# Patient Record
Sex: Female | Born: 1985 | Race: Black or African American | Hispanic: No | Smoking: Never smoker
Health system: Southern US, Community
[De-identification: ages and names within clinical notes are randomized; demographics above are authoritative.]

## PROBLEM LIST (undated history)

## (undated) ENCOUNTER — Inpatient Hospital Stay: Payer: Self-pay

## (undated) DIAGNOSIS — Z23 Encounter for immunization: Secondary | ICD-10-CM

## (undated) DIAGNOSIS — Z8619 Personal history of other infectious and parasitic diseases: Secondary | ICD-10-CM

## (undated) DIAGNOSIS — O219 Vomiting of pregnancy, unspecified: Secondary | ICD-10-CM

## (undated) DIAGNOSIS — F1290 Cannabis use, unspecified, uncomplicated: Secondary | ICD-10-CM

## (undated) DIAGNOSIS — N760 Acute vaginitis: Secondary | ICD-10-CM

## (undated) DIAGNOSIS — D649 Anemia, unspecified: Secondary | ICD-10-CM

## (undated) DIAGNOSIS — R519 Headache, unspecified: Secondary | ICD-10-CM

## (undated) DIAGNOSIS — B9689 Other specified bacterial agents as the cause of diseases classified elsewhere: Secondary | ICD-10-CM

## (undated) HISTORY — DX: Encounter for immunization: Z23

## (undated) HISTORY — DX: Acute vaginitis: N76.0

## (undated) HISTORY — DX: Personal history of other infectious and parasitic diseases: Z86.19

## (undated) HISTORY — DX: Other specified bacterial agents as the cause of diseases classified elsewhere: B96.89

## (undated) HISTORY — DX: Cannabis use, unspecified, uncomplicated: F12.90

## (undated) HISTORY — DX: Anemia, unspecified: D64.9

## (undated) HISTORY — PX: NO PAST SURGERIES: SHX2092

## (undated) HISTORY — DX: Vomiting of pregnancy, unspecified: O21.9

## (undated) HISTORY — DX: Headache, unspecified: R51.9

---
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
## 2007-05-09 ENCOUNTER — Emergency Department: Payer: Self-pay | Admitting: Emergency Medicine

## 2007-06-02 ENCOUNTER — Emergency Department: Payer: Self-pay | Admitting: Emergency Medicine

## 2007-07-31 ENCOUNTER — Emergency Department: Payer: Self-pay | Admitting: Internal Medicine

## 2008-03-18 ENCOUNTER — Emergency Department: Payer: Self-pay | Admitting: Emergency Medicine

## 2008-05-11 ENCOUNTER — Emergency Department: Payer: Self-pay | Admitting: Emergency Medicine

## 2008-09-19 ENCOUNTER — Observation Stay: Payer: Self-pay | Admitting: Obstetrics and Gynecology

## 2008-11-17 ENCOUNTER — Observation Stay: Payer: Self-pay | Admitting: Obstetrics and Gynecology

## 2009-08-16 DIAGNOSIS — Z8619 Personal history of other infectious and parasitic diseases: Secondary | ICD-10-CM

## 2009-08-16 HISTORY — DX: Personal history of other infectious and parasitic diseases: Z86.19

## 2011-03-31 ENCOUNTER — Emergency Department: Payer: Self-pay | Admitting: *Deleted

## 2011-12-09 ENCOUNTER — Emergency Department: Payer: Self-pay | Admitting: Internal Medicine

## 2013-04-17 ENCOUNTER — Emergency Department: Payer: Self-pay | Admitting: Emergency Medicine

## 2013-04-17 LAB — COMPREHENSIVE METABOLIC PANEL
Alkaline Phosphatase: 71 U/L (ref 50–136)
Bilirubin,Total: 0.4 mg/dL (ref 0.2–1.0)
Calcium, Total: 9.2 mg/dL (ref 8.5–10.1)
Chloride: 108 mmol/L — ABNORMAL HIGH (ref 98–107)
Co2: 27 mmol/L (ref 21–32)
Creatinine: 0.95 mg/dL (ref 0.60–1.30)
EGFR (African American): >60
Glucose: 91 mg/dL (ref 65–99)
Osmolality: 281 (ref 275–301)
Potassium: 3.6 mmol/L (ref 3.5–5.1)

## 2013-04-17 LAB — URINALYSIS, COMPLETE
Bilirubin,UR: NEGATIVE
Glucose,UR: NEGATIVE mg/dL (ref 0–75)
Leukocyte Esterase: NEGATIVE
Ph: 6 (ref 4.5–8.0)
Protein: NEGATIVE
RBC,UR: 2 /HPF (ref 0–5)
Specific Gravity: 1.027 (ref 1.003–1.030)
WBC UR: 7 /HPF (ref 0–5)

## 2013-04-17 LAB — CBC WITH DIFFERENTIAL/PLATELET
Basophil #: 0 10*3/uL (ref 0.0–0.1)
Basophil %: 0.7 %
Eosinophil #: 0.1 10*3/uL (ref 0.0–0.7)
Eosinophil %: 1.9 %
HCT: 36.5 % (ref 35.0–47.0)
Lymphocyte #: 1.8 10*3/uL (ref 1.0–3.6)
Lymphocyte %: 44.6 %
MCHC: 33.5 g/dL (ref 32.0–36.0)
Neutrophil #: 1.9 10*3/uL (ref 1.4–6.5)
Neutrophil %: 45.3 %
WBC: 4.1 10*3/uL (ref 3.6–11.0)

## 2013-08-14 ENCOUNTER — Emergency Department: Payer: Self-pay | Admitting: Emergency Medicine

## 2013-08-14 LAB — URINALYSIS, COMPLETE
Bilirubin,UR: NEGATIVE
Blood: NEGATIVE
Glucose,UR: NEGATIVE mg/dL (ref 0–75)
Ketone: NEGATIVE
Nitrite: NEGATIVE
Protein: NEGATIVE
RBC,UR: 4 /HPF (ref 0–5)
Specific Gravity: 1.023 (ref 1.003–1.030)
Squamous Epithelial: 17

## 2014-11-06 ENCOUNTER — Emergency Department: Payer: Self-pay | Admitting: Emergency Medicine

## 2015-03-22 ENCOUNTER — Encounter: Payer: Self-pay | Admitting: Emergency Medicine

## 2015-03-22 ENCOUNTER — Emergency Department: Payer: Self-pay

## 2015-03-22 ENCOUNTER — Emergency Department
Admission: EM | Admit: 2015-03-22 | Discharge: 2015-03-22 | Disposition: A | Discharge status: Home / Self Care | Payer: Self-pay | Attending: Emergency Medicine | Admitting: Emergency Medicine

## 2015-03-22 DIAGNOSIS — O9989 Other specified diseases and conditions complicating pregnancy, childbirth and the puerperium: Secondary | ICD-10-CM | POA: Insufficient documentation

## 2015-03-22 DIAGNOSIS — O23591 Infection of other part of genital tract in pregnancy, first trimester: Secondary | ICD-10-CM | POA: Insufficient documentation

## 2015-03-22 DIAGNOSIS — Z3A08 8 weeks gestation of pregnancy: Secondary | ICD-10-CM | POA: Insufficient documentation

## 2015-03-22 DIAGNOSIS — M545 Low back pain: Secondary | ICD-10-CM | POA: Insufficient documentation

## 2015-03-22 DIAGNOSIS — B9689 Other specified bacterial agents as the cause of diseases classified elsewhere: Secondary | ICD-10-CM

## 2015-03-22 DIAGNOSIS — N760 Acute vaginitis: Secondary | ICD-10-CM

## 2015-03-22 DIAGNOSIS — M549 Dorsalgia, unspecified: Secondary | ICD-10-CM

## 2015-03-22 LAB — URINALYSIS COMPLETE WITH MICROSCOPIC (ARMC ONLY)
BILIRUBIN URINE: NEGATIVE
Bacteria, UA: NONE SEEN
GLUCOSE, UA: NEGATIVE mg/dL
HGB URINE DIPSTICK: NEGATIVE
KETONES UR: NEGATIVE mg/dL
LEUKOCYTES UA: NEGATIVE
Nitrite: NEGATIVE
PROTEIN: NEGATIVE mg/dL
SPECIFIC GRAVITY, URINE: 1.013 (ref 1.005–1.030)
pH: 9 — ABNORMAL HIGH (ref 5.0–8.0)

## 2015-03-22 LAB — CHLAMYDIA/NGC RT PCR (ARMC ONLY)
CHLAMYDIA TR: NOT DETECTED
N GONORRHOEAE: NOT DETECTED

## 2015-03-22 LAB — WET PREP, GENITAL
Trich, Wet Prep: NONE SEEN
Yeast Wet Prep HPF POC: NONE SEEN

## 2015-03-22 LAB — HCG, QUANTITATIVE, PREGNANCY: HCG, BETA CHAIN, QUANT, S: 68350 m[IU]/mL — AB (ref <5)

## 2015-03-22 LAB — POCT PREGNANCY, URINE: Preg Test, Ur: POSITIVE — AB

## 2015-03-22 MED ORDER — ACETAMINOPHEN 500 MG PO TABS
1000.0000 mg | ORAL_TABLET | Freq: Once | ORAL | Status: AC
  Administered 2015-03-22: 1000 mg via ORAL
  Filled 2015-03-22: qty 2

## 2015-03-22 MED ORDER — METRONIDAZOLE 250 MG PO TABS: 250.0000 mg | ORAL_TABLET | Freq: Three times a day (TID) | ORAL | Status: AC

## 2015-03-22 NOTE — ED Notes (Signed)
Back from ultrasound

## 2015-03-22 NOTE — ED Notes (Signed)
POC U PREG , positive

## 2015-03-22 NOTE — ED Provider Notes (Signed)
[facility] Emergency Department Provider Note ____________________________________________  Time seen: 1240  I have reviewed the triage vital signs and the nursing notes.  HISTORY  Chief Complaint  Abdominal Pain  HPI Samantha Heath is a 29 y.o. female who reports to the ED with onset of low back pain since this morning. She also has noticed some discharge this morning to her panties, but denies any irregular vaginal bleeding. She reports a last menstrual period of June 10, and has confirmed pregnancy with a home test and health Department pregnancy test.  History reviewed. No pertinent past medical history.  There are no active problems to display for this patient.  History reviewed. No pertinent past surgical history.  Current Outpatient Rx  Name  Route  Sig  Dispense  Refill  . metroNIDAZOLE (FLAGYL) 250 MG tablet   Oral   Take 1 tablet (250 mg total) by mouth 3 (three) times daily.   21 tablet   0     Allergies Review of patient's allergies indicates no known allergies.  No family history on file.  Social History History  Substance Use Topics  . Smoking status: Never Smoker   . Smokeless tobacco: Not on file  . Alcohol Use: Not on file   Review of Systems  Constitutional: Negative for fever. Eyes: Negative for visual changes. ENT: Negative for sore throat. Cardiovascular: Negative for chest pain. Respiratory: Negative for shortness of breath. Gastrointestinal: Negative for abdominal pain, vomiting and diarrhea. Genitourinary: Negative for dysuria. Reports vaginal discharge. Denies vaginal bleeding Musculoskeletal: Negative for back pain. Skin: Negative for rash. Neurological: Negative for headaches, focal weakness or numbness. ____________________________________________  PHYSICAL EXAM:  VITAL SIGNS: ED Triage Vitals  Enc Vitals Group     BP 03/22/15 1145 103/73 mmHg     Pulse Rate 03/22/15 1145 86     Resp 03/22/15 1145 18      Temp 03/22/15 1145 98.2 F (36.8 C)     Temp Source 03/22/15 1145 Oral     SpO2 03/22/15 1145 100 %     Weight 03/22/15 1145 164 lb (74.39 kg)     Height 03/22/15 1145 5\' 10"  (1.778 m)     Head Cir --      Peak Flow --      Pain Score 03/22/15 1146 6     Pain Loc --      Pain Edu? --      Excl. in GC? --    Constitutional: Alert and oriented. Well appearing and in no distress. Eyes: Conjunctivae are normal. PERRL. Normal extraocular movements. ENT   Head: Normocephalic and atraumatic.   Nose: No congestion/rhinnorhea.   Mouth/Throat: Mucous membranes are moist.   Neck: Supple. No thyromegaly. Hematological/Lymphatic/Immunilogical: No cervical lymphadenopathy. Cardiovascular: Normal rate, regular rhythm.  Respiratory: Normal respiratory effort. No wheezes/rales/rhonchi. Gastrointestinal: Soft and nontender. No distention. GU: Normal external genitalia. Vagina with thin, white discharge. Cervical os closed. No adnexal tenderness or masses.  Musculoskeletal: Nontender with normal range of motion in all extremities.  Neurologic:  Normal gait without ataxia. Normal speech and language. No gross focal neurologic deficits are appreciated. Skin:  Skin is warm, dry and intact. No rash noted. Psychiatric: Mood and affect are normal. Patient exhibits appropriate insight and judgment. ____________________________________________    LABS (pertinent positives/negatives) Labs Reviewed  WET PREP, GENITAL - Abnormal; Notable for the following:    Clue Cells Wet Prep HPF POC MODERATE (*)    WBC, Wet Prep HPF POC FEW (*)  All other components within normal limits  HCG, QUANTITATIVE, PREGNANCY - Abnormal; Notable for the following:    hCG, Beta Chain, Quant, S 16109 (*)    All other components within normal limits  URINALYSIS COMPLETEWITH MICROSCOPIC ([facility] ONLY) - Abnormal; Notable for the following:    Color, Urine STRAW (*)    APPearance CLEAR (*)    pH 9.0 (*)     Squamous Epithelial / LPF 6-30 (*)    All other components within normal limits  POCT PREGNANCY, URINE - Abnormal; Notable for the following:    Preg Test, Ur POSITIVE (*)    All other components within normal limits  CHLAMYDIA/NGC RT PCR ([facility] ONLY)  POC URINE PREG, ED  ____________________________________________  RADIOLOGY  OB Ultrasound <14 weeks IMPRESSION: Single live intrauterine gestation 8 weeks 1 day by LMP.  I, Darrelle Wiberg, Charlesetta Ivory, personally viewed and evaluated these images as part of my medical decision making.  ____________________________________________  INITIAL IMPRESSION / ASSESSMENT AND PLAN / ED COURSE  Radiology and lab results to patient. Treatment for BV with Flagyl 250 mg. Exam within normal limits. Follow-up with [facility] for routine prenatal care.  ____________________________________________  FINAL CLINICAL IMPRESSION(S) / ED DIAGNOSES  Final diagnoses:  Back pain affecting pregnancy in first trimester  BV (bacterial vaginosis)     Lissa Hoard, PA-C 03/22/15 1647  Emily Filbert, MD 03/23/15 [phone]

## 2015-03-22 NOTE — Discharge Instructions (Signed)
Abdominal Pain During Pregnancy Abdominal pain is common in pregnancy. Most of the time, it does not cause harm. There are many causes of abdominal pain. Some causes are more serious than others. Some of the causes of abdominal pain in pregnancy are easily diagnosed. Occasionally, the diagnosis takes time to understand. Other times, the cause is not determined. Abdominal pain can be a sign that something is very wrong with the pregnancy, or the pain may have nothing to do with the pregnancy at all. For this reason, always tell your health care provider if you have any abdominal discomfort. HOME CARE INSTRUCTIONS  Monitor your abdominal pain for any changes. The following actions may help to alleviate any discomfort you are experiencing:  Do not have sexual intercourse or put anything in your vagina until your symptoms go away completely.  Get plenty of rest until your pain improves.  Drink clear fluids if you feel nauseous. Avoid solid food as long as you are uncomfortable or nauseous.  Only take over-the-counter or prescription medicine as directed by your health care provider.  Keep all follow-up appointments with your health care provider. SEEK IMMEDIATE MEDICAL CARE IF:  You are bleeding, leaking fluid, or passing tissue from the vagina.  You have increasing pain or cramping.  You have persistent vomiting.  You have painful or bloody urination.  You have a fever.  You notice a decrease in your baby's movements.  You have extreme weakness or feel faint.  You have shortness of breath, with or without abdominal pain.  You develop a severe headache with abdominal pain.  You have abnormal vaginal discharge with abdominal pain.  You have persistent diarrhea.  You have abdominal pain that continues even after rest, or gets worse. MAKE SURE YOU:   Understand these instructions.  Will watch your condition.  Will get help right away if you are not doing well or get  worse. Document Released: 08/02/2005 Document Revised: 05/23/2013 Document Reviewed: 03/01/2013 [facility] Patient Information 2015 [place], [place]. This information is not intended to replace advice given to you by your health care provider. Make sure you discuss any questions you have with your health care provider.  Back Pain in Pregnancy Back pain during pregnancy is common. It happens in about half of all pregnancies. It is important for you and your baby that you remain active during your pregnancy.If you feel that back pain is not allowing you to remain active or sleep well, it is time to see your caregiver. Back pain may be caused by several factors related to changes during your pregnancy.Fortunately, unless you had trouble with your back before your pregnancy, the pain is likely to get better after you deliver. Low back pain usually occurs between the fifth and seventh months of pregnancy. It can, however, happen in the first couple months. Factors that increase the risk of back problems include:   Previous back problems.  Injury to your back.  Having twins or multiple births.  A chronic cough.  Stress.  Job-related repetitive motions.  Muscle or spinal disease in the back.  Family history of back problems, ruptured (herniated) discs, or osteoporosis.  Depression, anxiety, and panic attacks. CAUSES   When you are pregnant, your body produces a hormone called relaxin. This hormonemakes the ligaments connecting the low back and pubic bones more flexible. This flexibility allows the baby to be delivered more easily. When your ligaments are loose, your muscles need to work harder to support your back. Soreness in your back  can come from tired muscles. Soreness can also come from back tissues that are irritated since they are receiving less support.  As the baby grows, it puts pressure on the nerves and blood vessels in your pelvis. This can cause back pain.  As the baby grows  and gets heavier during pregnancy, the uterus pushes the stomach muscles forward and changes your center of gravity. This makes your back muscles work harder to maintain good posture. SYMPTOMS  Lumbar pain during pregnancy Lumbar pain during pregnancy usually occurs at or above the waist in the center of the back. There may be pain and numbness that radiates into your leg or foot. This is similar to low back pain experienced by non-pregnant women. It usually increases with sitting for long periods of time, standing, or repetitive lifting. Tenderness may also be present in the muscles along your upper back. Posterior pelvic pain during pregnancy Pain in the back of the pelvis is more common than lumbar pain in pregnancy. It is a deep pain felt in your side at the waistline, or across the tailbone (sacrum), or in both places. You may have pain on one or both sides. This pain can also go into the buttocks and backs of the upper thighs. Pubic and groin pain may also be present. The pain does not quickly resolve with rest, and morning stiffness may also be present. Pelvic pain during pregnancy can be brought on by most activities. A high level of fitness before and during pregnancy may or may not prevent this problem. Labor pain is usually 1 to 2 minutes apart, lasts for about 1 minute, and involves a bearing down feeling or pressure in your pelvis. However, if you are at term with the pregnancy, constant low back pain can be the beginning of early labor, and you should be aware of this. DIAGNOSIS  X-rays of the back should not be done during the first 12 to 14 weeks of the pregnancy and only when absolutely necessary during the rest of the pregnancy. MRIs do not give off radiation and are safe during pregnancy. MRIs also should only be done when absolutely necessary. HOME CARE INSTRUCTIONS  Exercise as directed by your caregiver. Exercise is the most effective way to prevent or manage back pain. If you have a  back problem, it is especially important to avoid sports that require sudden body movements. Swimming and walking are great activities.  Do not stand in one place for long periods of time.  Do not wear high heels.  Sit in chairs with good posture. Use a pillow on your lower back if necessary. Make sure your head rests over your shoulders and is not hanging forward.  Try sleeping on your side, preferably the left side, with a pillow or two between your legs. If you are sore after a night's rest, your bedmay betoo soft.Try placing a board between your mattress and box spring.  Listen to your body when lifting.If you are experiencing pain, ask for help or try bending yourknees more so you can use your leg muscles rather than your back muscles. Squat down when picking up something from the floor. Do not bend over.  Eat a healthy diet. Try to gain weight within your caregiver's recommendations.  Use heat or cold packs 3 to 4 times a day for 15 minutes to help with the pain.  Only take over-the-counter or prescription medicines for pain, discomfort, or fever as directed by your caregiver. Sudden (acute) back pain  Use bed rest for only the most extreme, acute episodes of back pain. Prolonged bed rest over 48 hours will aggravate your condition.  Ice is very effective for acute conditions.  Put ice in a plastic bag.  Place a towel between your skin and the bag.  Leave the ice on for 10 to 20 minutes every 2 hours, or as needed.  Using heat packs for 30 minutes prior to activities is also helpful. Continued back pain See your caregiver if you have continued problems. Your caregiver can help or refer you for appropriate physical therapy. With conditioning, most back problems can be avoided. Sometimes, a more serious issue may be the cause of back pain. You should be seen right away if new problems seem to be developing. Your caregiver may recommend:  A maternity girdle.  An elastic  sling.  A back brace.  A massage therapist or acupuncture. SEEK MEDICAL CARE IF:   You are not able to do most of your daily activities, even when taking the pain medicine you were given.  You need a referral to a physical therapist or chiropractor.  You want to try acupuncture. SEEK IMMEDIATE MEDICAL CARE IF:  You develop numbness, tingling, weakness, or problems with the use of your arms or legs.  You develop severe back pain that is no longer relieved with medicines.  You have a sudden change in bowel or bladder control.  You have increasing pain in other areas of the body.  You develop shortness of breath, dizziness, or fainting.  You develop nausea, vomiting, or sweating.  You have back pain which is similar to labor pains.  You have back pain along with your water breaking or vaginal bleeding.  You have back pain or numbness that travels down your leg.  Your back pain developed after you fell.  You develop pain on one side of your back. You may have a kidney stone.  You see blood in your urine. You may have a bladder infection or kidney stone.  You have back pain with blisters. You may have shingles. Back pain is fairly common during pregnancy but should not be accepted as just part of the process. Back pain should always be treated as soon as possible. This will make your pregnancy as pleasant as possible. Document Released: 11/10/2005 Document Revised: 10/25/2011 Document Reviewed: 12/22/2010 [facility] Patient Information 2015 [place], [place]. This information is not intended to replace advice given to you by your health care provider. Make sure you discuss any questions you have with your health care provider.  Bacterial Vaginosis Bacterial vaginosis is an infection of the vagina. It happens when too many of certain germs (bacteria) grow in the vagina. HOME CARE  Take your medicine as told by your doctor.  Finish your medicine even if you start to feel  better.  Do not have sex until you finish your medicine and are better.  Tell your sex partner that you have an infection. They should see their doctor for treatment.  Practice safe sex. Use condoms. Have only one sex partner. GET HELP IF:  You are not getting better after 3 days of treatment.  You have more grey fluid (discharge) coming from your vagina than before.  You have more pain than before.  You have a fever. MAKE SURE YOU:   Understand these instructions.  Will watch your condition.  Will get help right away if you are not doing well or get worse. Document Released: 05/11/2008 Document Revised: 05/23/2013 Document  Reviewed: 03/14/2013 [employer] Patient Information 2015 [place], [place]. This information is not intended to replace advice given to you by your health care provider. Make sure you discuss any questions you have with your health care provider.  Take the prescription medicine as directed to treat your infection. Follow-up with [facility] as scheduled. Take Tylenol for back pain. Return as needed. Daisey Must!

## 2015-03-22 NOTE — ED Notes (Signed)
States she developed lower back pain this am . Pain is non radiating. And ambulates well to treatment room. Also states she noticed some discharge in panties this am  No vaginal bleeding.

## 2015-03-22 NOTE — ED Notes (Signed)
Positive preg test at home and at health department with in the last week, last night with lower abd cramping and lower back pain, denies any urinary symptoms , noticed white cloudy vaginal discharge.

## 2015-05-12 ENCOUNTER — Ambulatory Visit (INDEPENDENT_AMBULATORY_CARE_PROVIDER_SITE_OTHER): Payer: Medicaid Other

## 2015-05-12 VITALS — BP 101/63 | HR 98 | Wt 167.9 lb

## 2015-05-12 DIAGNOSIS — O219 Vomiting of pregnancy, unspecified: Secondary | ICD-10-CM

## 2015-05-12 DIAGNOSIS — Z3492 Encounter for supervision of normal pregnancy, unspecified, second trimester: Secondary | ICD-10-CM

## 2015-05-12 MED ORDER — DOXYLAMINE-DM 12.5-30 MG/10ML PO LIQD: 12.5000 mg | Freq: Every day | ORAL | Status: DC

## 2015-05-12 MED ORDER — VITAMIN B-6 25 MG PO TABS: 25.0000 mg | ORAL_TABLET | Freq: Three times a day (TID) | ORAL | Status: DC

## 2015-05-12 NOTE — Patient Instructions (Signed)

## 2015-05-12 NOTE — Progress Notes (Signed)
[place] Spaniel for NOB nurse interview visit. G3-.  P2002-. U/s on 8/6  edd -10/31/2015 15 2/7 Pregnancy education material explained and given. _NO__ cats in the home. NOB labs ordered. HIV labs and Drug screen were explained. Drug screen ordered and sent to lab. PNV encouraged. NT too late. Will discuss 2nd trimester and panarama with provider. Pt. To follow up with provider in _1_ week for NOB physical.  Last pap 2016- wnl- no h/o of abnormal. Pt c/o of constant nausea. No vomiting. Will send in  diclegis.  All questions answered.  ZIKA EXPOSURE SCREEN:  The patient has not traveled to a [place] Virus endemic area within the past 6 months, nor has she had unprotected sex with a partner who has travelled to a [place] endemic region within the past 6 months. The patient has been advised to notify us if these factors change any time during this current pregnancy, so adequate testing and monitoring can be initiated.

## 2015-05-14 LAB — URINALYSIS, ROUTINE W REFLEX MICROSCOPIC
BILIRUBIN UA: NEGATIVE
GLUCOSE, UA: NEGATIVE
KETONES UA: NEGATIVE
Leukocytes, UA: NEGATIVE
NITRITE UA: NEGATIVE
RBC, UA: NEGATIVE
SPEC GRAV UA: 1.024 (ref 1.005–1.030)
UUROB: 1 mg/dL (ref 0.2–1.0)
pH, UA: 7 (ref 5.0–7.5)

## 2015-05-14 LAB — GC/CHLAMYDIA PROBE AMP
Chlamydia trachomatis, NAA: NEGATIVE
Neisseria gonorrhoeae by PCR: NEGATIVE

## 2015-05-14 LAB — URINE CULTURE

## 2015-05-19 ENCOUNTER — Encounter: Payer: Self-pay | Admitting: Obstetrics and Gynecology

## 2015-05-22 ENCOUNTER — Encounter: Payer: Self-pay | Admitting: Obstetrics and Gynecology

## 2015-05-22 ENCOUNTER — Ambulatory Visit (INDEPENDENT_AMBULATORY_CARE_PROVIDER_SITE_OTHER): Payer: Medicaid Other | Admitting: Obstetrics and Gynecology

## 2015-05-22 VITALS — BP 104/69 | HR 88 | Temp 98.5°F | Wt 164.7 lb

## 2015-05-22 DIAGNOSIS — F1210 Cannabis abuse, uncomplicated: Secondary | ICD-10-CM

## 2015-05-22 DIAGNOSIS — Z3492 Encounter for supervision of normal pregnancy, unspecified, second trimester: Secondary | ICD-10-CM

## 2015-05-22 DIAGNOSIS — Z1379 Encounter for other screening for genetic and chromosomal anomalies: Secondary | ICD-10-CM

## 2015-05-22 LAB — POCT URINALYSIS DIP (MANUAL ENTRY)
Bilirubin, UA: NEGATIVE
Blood, UA: NEGATIVE
Glucose, UA: NEGATIVE
Ketones, POC UA: NEGATIVE
Leukocytes, UA: NEGATIVE
Nitrite, UA: NEGATIVE
PH UA: 5
PROTEIN UA: NEGATIVE
SPEC GRAV UA: 1.01
UROBILINOGEN UA: 0.2

## 2015-05-22 MED ORDER — VITAMIN B-6 25 MG PO TABS: 25.0000 mg | ORAL_TABLET | Freq: Three times a day (TID) | ORAL | Status: DC

## 2015-05-22 MED ORDER — CVS PRENATAL GUMMY 0.4-113.5 MG PO CHEW: 1.0000 | CHEWABLE_TABLET | Freq: Every day | ORAL | Status: DC

## 2015-05-22 MED ORDER — DOXYLAMINE-DM 12.5-30 MG/10ML PO LIQD: 12.5000 mg | Freq: Every day | ORAL | Status: DC

## 2015-05-23 ENCOUNTER — Encounter: Payer: Self-pay | Admitting: Obstetrics and Gynecology

## 2015-05-23 DIAGNOSIS — F1291 Cannabis use, unspecified, in remission: Secondary | ICD-10-CM | POA: Insufficient documentation

## 2015-05-23 DIAGNOSIS — F1210 Cannabis abuse, uncomplicated: Secondary | ICD-10-CM | POA: Insufficient documentation

## 2015-05-23 LAB — HIV ANTIBODY (ROUTINE TESTING W REFLEX): HIV SCREEN 4TH GENERATION: NONREACTIVE

## 2015-05-23 LAB — CBC WITH DIFFERENTIAL/PLATELET
BASOS: 0 %
Basophils Absolute: 0 10*3/uL (ref 0.0–0.2)
EOS (ABSOLUTE): 0.1 10*3/uL (ref 0.0–0.4)
EOS: 1 %
HEMOGLOBIN: 11.9 g/dL (ref 11.1–15.9)
Hematocrit: 35.1 % (ref 34.0–46.6)
Immature Grans (Abs): 0 10*3/uL (ref 0.0–0.1)
Immature Granulocytes: 0 %
LYMPHS ABS: 1.4 10*3/uL (ref 0.7–3.1)
Lymphs: 24 %
MCH: 28.6 pg (ref 26.6–33.0)
MCHC: 33.9 g/dL (ref 31.5–35.7)
MCV: 84 fL (ref 79–97)
MONOCYTES: 8 %
MONOS ABS: 0.5 10*3/uL (ref 0.1–0.9)
Neutrophils Absolute: 3.9 10*3/uL (ref 1.4–7.0)
Neutrophils: 67 %
Platelets: 263 10*3/uL (ref 150–379)
RBC: 4.16 x10E6/uL (ref 3.77–5.28)
RDW: 15.2 % (ref 12.3–15.4)
WBC: 5.9 10*3/uL (ref 3.4–10.8)

## 2015-05-23 LAB — VARICELLA ZOSTER ANTIBODY, IGG: VARICELLA: 1285 {index} (ref >165)

## 2015-05-23 LAB — RUBELLA SCREEN: RUBELLA: 6.3 {index} (ref >0.99)

## 2015-05-23 LAB — ABO AND RH: Rh Factor: POSITIVE

## 2015-05-23 LAB — SICKLE CELL SCREEN: Sickle Cell Screen: NEGATIVE

## 2015-05-23 LAB — ANTIBODY SCREEN: Antibody Screen: NEGATIVE

## 2015-05-23 LAB — RPR: RPR: NONREACTIVE

## 2015-05-23 LAB — HEPATITIS B SURFACE ANTIGEN: Hepatitis B Surface Ag: NEGATIVE

## 2015-05-23 NOTE — Progress Notes (Signed)
Subjective:    Samantha Heath is being seen today for her first obstetrical visit.  This is not a planned pregnancy. She is at [redacted]w[redacted]d gestation, by 8 week ER scan, with Estimated Date of Delivery: 10/31/15.  Patient's last menstrual period was 01/24/2015 (approximate). Her obstetrical history is significant for marijuana abuse. Relationship with FOB: significant other, not living together. Patient does intend to breast feed. Pregnancy history fully reviewed.  Menstrual History: Obstetric History   G3   P2   T2   P0   A0   TAB0   SAB0   E0   M0   L2     # Outcome Date GA Lbr Len/2nd Weight Sex Delivery Anes PTL Lv  3 Current           2 Term 2010 [redacted]w[redacted]d  7 lb 4.8 oz (3.311 kg) M Vag-Spont   Y     Complications: Oligohydramnios  1 Term 2005 [redacted]w[redacted]d  6 lb (2.722 kg) M Vag-Spont   Y     Complications: Oligohydramnios    Obstetric Comments  Induction for oligohydramnios both pregnancies    Menarche age: 17 Patient's last menstrual period was 01/24/2015 (approximate).  Denies h/o abnormal pap smears.  Notes remote h/o gonorrhea.  Last pap in April 2016 per patient (performed in [place]), negative.     Past Medical History  Diagnosis Date  . Nausea and vomiting during pregnancy   . History of gonorrhea 2011  . Bacterial vaginal infection   . Flu vaccine need     at 16 weeks  . Need for Tdap vaccination     at 28 weeks  . Marijuana use     last used 04/2015    Past Surgical History  Procedure Laterality Date  . No past surgeries      Family History  Problem Relation Age of Onset  . Cancer Neg Hx   . Diabetes Neg Hx   . Heart disease Neg Hx     Social History   Social History  . Marital Status: Single    Spouse Name: N/A  . Number of Children: N/A  . Years of Education: N/A   Occupational History  . Not on file.   Social History Main Topics  . Smoking status: Never Smoker   . Smokeless tobacco: Not on file  . Alcohol Use: Yes     Comment: occas   . Drug Use: Yes     Special: Marijuana     Comment: rare- last used 02/17/2015  . Sexual Activity: Yes    Birth Control/ Protection: None   Other Topics Concern  . Not on file   Social History Narrative    Current Outpatient Prescriptions on File Prior to Visit  Medication Sig Dispense Refill  . Prenatal Vit-Fe Fumarate-FA (MULTIVITAMIN-PRENATAL) 27-0.8 MG TABS tablet Take 1 tablet by mouth daily at 12 noon.     No current facility-administered medications on file prior to visit.    No Known Allergies  Review of Systems General:Not Present- Fever, Weight Loss and Weight Gain. Skin:Not Present- Rash. HEENT:Not Present- Blurred Vision, Headache and Bleeding Gums. Respiratory:Not Present- Difficulty Breathing. Breast:Not Present- Breast Mass. Cardiovascular:Not Present- Chest Pain, Elevated Blood Pressure, Fainting / Blacking Out and Shortness of Breath. Gastrointestinal:Present -  Nausea and Vomiting. Not Present- Abdominal Pain, Constipation. Female Genitourinary:Not Present- Frequency, Painful Urination, Pelvic Pain, Vaginal Bleeding, Vaginal Discharge, Contractions, regular, Fetal Movements Decreased, Urinary Complaints and Vaginal Fluid. Musculoskeletal:Not Present- Back Pain and Leg  Cramps. Neurological:Not Present- Dizziness. Psychiatric:Not Present- Depression.    Objective:    BP 104/69 mmHg  Pulse 88  Temp(Src) 98.5 F (36.9 C)  Wt 164 lb 11.2 oz (74.707 kg)  LMP 01/24/2015 (Approximate)    General Appearance:    Alert, cooperative, no distress, appears stated age  Head:    Normocephalic, without obvious abnormality, atraumatic  Eyes:    PERRL, conjunctiva/corneas clear, EOM's intact, both eyes  Ears:    Normal external ear canals, both ears  Nose:   Nares normal, septum midline, mucosa normal, no drainage or sinus tenderness  Throat:   Lips, mucosa, and tongue normal; teeth and gums normal  Neck:   Supple, symmetrical, trachea midline, no adenopathy; thyroid: no  enlargement/tenderness/nodules; no carotid bruit or JVD  Back:     Symmetric, no curvature, ROM normal, no CVA tenderness  Lungs:     Clear to auscultation bilaterally, respirations unlabored  Chest Wall:    No tenderness or deformity   Heart:    Regular rate and rhythm, S1 and S2 normal, no murmur, rub or gallop  Breast Exam:    No tenderness, masses, or nipple abnormality  Abdomen:     Soft, non-tender, bowel sounds active all four quadrants, no masses, no organomegaly.  FH 16.  FHT 151  bpm.  Genitalia:    Pelvic:external genitalia normal, vagina with lesions, discharge, or tenderness, rectovaginal septum  normal. Cervix normal in appearance, no cervical motion tenderness, no adnexal masses or tenderness.  Pregnancy positive findings: uterine enlargement: 16wk size, nontender.   Rectal:    Normal external sphincter.  No hemorrhoids appreciated. Internal exam not done.   Extremities:   Extremities normal, atraumatic, no cyanosis or edema  Pulses:   2+ and symmetric all extremities  Skin:   Skin color, texture, turgor normal, no rashes or lesions  Lymph nodes:   Cervical, supraclavicular, and axillary nodes normal  Neurologic:   CNII-XII intact, normal strength, sensation and reflexes throughout    Assessment:   Pregnancy at 16 and 6/7 weeks   Marijuana use Nausea/vomiting of pregnancy H/o oligohydramnios in prior pregnancies   Plan:    Initial labs drawn. Prenatal vitamins (gummies) discussed, as patient notes difficulty tolerating tablets.  Counseled on cessation of marijuana in pregnancy.  Patient notes using for control of nausea/vomiting. Previously e-prescribed Vit B6 and Doxylamine, however patient reports that she was told by pharmacy that there was no prescription available.  Printed prescriptions or patient to take to pharmacy.  Problem list reviewed and updated. Patient out of range for first trimester screen.  Second trimester AFP4 discussed: ordered. Role of ultrasound in  pregnancy discussed; fetal survey: ordered.  Will also f/u in 3rd trimester for assessment of oligohydramnios if warranted, based on patient's history.  Follow up in 4 weeks. For flu vaccine at next visit.   Hildred Laser, MD [facility]

## 2015-06-02 LAB — AFP, QUAD SCREEN
DIA Mom Value: 1.47
DIA Value (EIA): 239.55 pg/mL
DSR (By Age)    1 IN: 878
DSR (Second Trimester) 1 IN: 1071
GESTATIONAL AGE AFP: 16.9 wk
MATERNAL AGE AT EDD: 27.7 a
MSAFP Mom: 1.16
MSAFP: 43.4 ng/mL
MSHCG Mom: 1.17
MSHCG: 36683 m[IU]/mL
Osb Risk: 10000
T18 (By Age): 1:3420 {titer}
TEST RESULTS AFP: NEGATIVE
UE3 MOM: 0.66
Weight: 165 [lb_av]
uE3 Value: 0.69 ng/mL

## 2015-06-18 ENCOUNTER — Ambulatory Visit (INDEPENDENT_AMBULATORY_CARE_PROVIDER_SITE_OTHER): Payer: Medicaid Other | Admitting: Obstetrics and Gynecology

## 2015-06-18 ENCOUNTER — Ambulatory Visit: Payer: Medicaid Other

## 2015-06-18 VITALS — BP 104/67 | HR 106 | Wt 163.5 lb

## 2015-06-18 DIAGNOSIS — O219 Vomiting of pregnancy, unspecified: Secondary | ICD-10-CM | POA: Insufficient documentation

## 2015-06-18 DIAGNOSIS — Z3492 Encounter for supervision of normal pregnancy, unspecified, second trimester: Secondary | ICD-10-CM | POA: Diagnosis not present

## 2015-06-18 LAB — POCT URINALYSIS DIPSTICK
BILIRUBIN UA: NEGATIVE
Blood, UA: NEGATIVE
Glucose, UA: NEGATIVE
Ketones, UA: 40
LEUKOCYTES UA: NEGATIVE
Nitrite, UA: NEGATIVE
Spec Grav, UA: 1.02
UROBILINOGEN UA: NEGATIVE
pH, UA: 6

## 2015-06-18 NOTE — Progress Notes (Signed)
ROB: Patient doing well, no complaints. S/p normal, but incomplete, anatomy scan. Marginal placenta previa (3.8 cm from cervical os).  Will repeat scan in 4 weeks to f/u anatomy and placenta.  RTC in 4 weeks.

## 2015-06-18 NOTE — Patient Instructions (Signed)
Thank you for enrolling in MyChart. Please follow the instructions below to securely access your online medical record. MyChart allows you to send messages to your doctor, view your test results, renew your prescriptions, schedule appointments, and more.  How Do I Sign Up? In your Internet browser, go to [email or web address] 1. Click on the New  User? link in the Sign In box.  2. Enter your MyChart Access Code exactly as it appears below. You will not need to use this code after you have completed the sign-up process. If you do not sign up before the expiration date, you must request a new code. MyChart Access Code: [email or web address] Expires: 07/21/2015  1:05 PM  3. Enter the last four digits of your Social Security Number (xxxx) and Date of Birth (mm/dd/yyyy) as indicated and click Next. You will be taken to the next sign-up page. 4. Create a MyChart ID. This will be your MyChart login ID and cannot be changed, so think of one that is secure and easy to remember. 5. Create a MyChart password. You can change your password at any time. 6. Enter your Password Reset Question and Answer and click Next. This can be used at a later time if you forget your password.  7. Select your communication preference, and if applicable enter your e-mail address. You will receive e-mail notification when new information is available in MyChart by choosing to receive e-mail notifications and filling in your e-mail. 8. Click Sign In. You can now view your medical record.   Additional Information If you have questions, you can call [phone] to talk to our MyChart staff. Remember, MyChart is NOT to be used for urgent needs. For medical emergencies, dial 911.

## 2015-07-06 DIAGNOSIS — Z3493 Encounter for supervision of normal pregnancy, unspecified, third trimester: Secondary | ICD-10-CM | POA: Insufficient documentation

## 2015-07-16 ENCOUNTER — Ambulatory Visit (INDEPENDENT_AMBULATORY_CARE_PROVIDER_SITE_OTHER): Payer: Medicaid Other

## 2015-07-16 ENCOUNTER — Ambulatory Visit (INDEPENDENT_AMBULATORY_CARE_PROVIDER_SITE_OTHER): Payer: Medicaid Other | Admitting: Obstetrics and Gynecology

## 2015-07-16 ENCOUNTER — Encounter: Payer: Self-pay | Admitting: Obstetrics and Gynecology

## 2015-07-16 VITALS — BP 99/60 | HR 90 | Wt 170.3 lb

## 2015-07-16 DIAGNOSIS — Z131 Encounter for screening for diabetes mellitus: Secondary | ICD-10-CM

## 2015-07-16 DIAGNOSIS — Z3492 Encounter for supervision of normal pregnancy, unspecified, second trimester: Secondary | ICD-10-CM

## 2015-07-16 DIAGNOSIS — R42 Dizziness and giddiness: Secondary | ICD-10-CM

## 2015-07-16 DIAGNOSIS — O2652 Maternal hypotension syndrome, second trimester: Secondary | ICD-10-CM

## 2015-07-16 DIAGNOSIS — Z3482 Encounter for supervision of other normal pregnancy, second trimester: Secondary | ICD-10-CM

## 2015-07-16 LAB — POCT URINALYSIS DIPSTICK
Bilirubin, UA: NEGATIVE
Blood, UA: NEGATIVE
GLUCOSE UA: NEGATIVE
Ketones, UA: NEGATIVE
NITRITE UA: NEGATIVE
Protein, UA: NEGATIVE
Spec Grav, UA: <=1.005
UROBILINOGEN UA: NEGATIVE
pH, UA: 6.5

## 2015-07-16 NOTE — Progress Notes (Signed)
ROB: Doing well, complains of occasional episodes of dizziness.  BPs lower than normal, discussed symptoms of  hypotension in pregnancy. Advised on adequate hydration.  S/p f/u anatomy scan with normal remainder of anatomy.  Placenta no longer marginal, and is 6.3 cm from cervical os.  Can end pelvic rest.  RTC in 4 weeks, for 1 hr glucola, Tdap, and blood consent at that time.

## 2015-08-13 ENCOUNTER — Ambulatory Visit (INDEPENDENT_AMBULATORY_CARE_PROVIDER_SITE_OTHER): Payer: Medicaid Other | Admitting: Obstetrics and Gynecology

## 2015-08-13 VITALS — BP 114/76 | HR 109 | Wt 172.1 lb

## 2015-08-13 DIAGNOSIS — J069 Acute upper respiratory infection, unspecified: Secondary | ICD-10-CM

## 2015-08-13 DIAGNOSIS — Z3482 Encounter for supervision of other normal pregnancy, second trimester: Secondary | ICD-10-CM

## 2015-08-13 DIAGNOSIS — Z131 Encounter for screening for diabetes mellitus: Secondary | ICD-10-CM

## 2015-08-13 DIAGNOSIS — Z3492 Encounter for supervision of normal pregnancy, unspecified, second trimester: Secondary | ICD-10-CM

## 2015-08-13 LAB — POCT URINALYSIS DIPSTICK
Bilirubin, UA: NEGATIVE
Blood, UA: NEGATIVE
GLUCOSE UA: NEGATIVE
KETONES UA: NEGATIVE
Nitrite, UA: NEGATIVE
Protein, UA: NEGATIVE
SPEC GRAV UA: 1.01
UROBILINOGEN UA: NEGATIVE
pH, UA: 7.5

## 2015-08-13 NOTE — Progress Notes (Signed)
ROB: Patient reports URI symptoms. Denies fevers/chills. Taking Tylenol without relief.  Advised on Robitussin, nasal saline spray/nettie pot. Blood consent signed, completing 1 hr glucola.  RTC in 2 weeks.  For Tdap at that time.

## 2015-08-14 LAB — HEMOGLOBIN AND HEMATOCRIT, BLOOD
HEMOGLOBIN: 10.3 g/dL — AB (ref 11.1–15.9)
Hematocrit: 30.4 % — ABNORMAL LOW (ref 34.0–46.6)

## 2015-08-14 LAB — GLUCOSE TOLERANCE, 1 HOUR: Glucose, 1Hr PP: 128 mg/dL (ref 65–199)

## 2015-08-15 ENCOUNTER — Telehealth: Payer: Self-pay

## 2015-08-15 NOTE — Telephone Encounter (Signed)
-----   Message from Hildred LaserAnika Cherry, MD sent at 08/15/2015  9:35 AM EST ----- Please inform patient of normal glucola, and encourage MyChart usage.

## 2015-08-15 NOTE — Telephone Encounter (Signed)
Pt informed of normal glucola results

## 2015-08-17 NOTE — L&D Delivery Note (Signed)
Delivery Summary for Samantha Heath  Labor Events:   Preterm labor:   Rupture date:   Rupture time:   Rupture type:   Fluid Color: Clear  Induction:   Augmentation:   Complications:   Cervical ripening:          Delivery:   Episiotomy:   Lacerations:   Repair suture:   Repair # of packets:   Blood loss (ml): 300   Information for the patient's newborn:  Samantha Heath, Samantha Heath [[id number]][[id number]]    Delivery 10/28/2015 9:46 AM by  Vaginal, Spontaneous Delivery Sex:  female Gestational Age: 4723w4d Delivery Clinician:  Hildred LaserAnika Jw Heath Living?: Yes        APGARS  One minute Five minutes Ten minutes  Skin color: 0   1      Heart rate: 2   2      Grimace: 2   2      Muscle tone: 2   2      Breathing: 2   2      Totals: 8  9      Presentation/position:      Resuscitation: None  Cord information: 3 vessels   Disposition of cord blood:     Blood gases sent?  Complications:   Placenta: Delivered: 10/28/2015 9:51 AM  Spontaneous  Intact appearance Newborn Measurements: Weight: 7 lb 4.8 oz (3310 g)  Height: 19.49"  Head circumference: 34 cm  Chest circumference: 34 cm  Other providers: Registered Nurse Transition RN Registered Nurse Irwin BrakemanKelly A Yates Katrina R Fleeman Shantonette Noreene LarssonM Payne  Additional  information: Forceps:   Vacuum:   Breech:   Observed anomalies       Delivery Note At 9:46 AM a viable and healthy female was delivered via Vaginal, Spontaneous Delivery (Presentation: vertex; Left Occiput Anterior ).  APGAR: 8, 9; weight 7 lb 4.8 oz (3310 g).   Placenta status: Intact, Spontaneous.  Cord: 3 vessels with the following complications: None.  Cord pH: not obtained  Anesthesia: Epidural  Episiotomy: None Lacerations: None Suture Repair: None Est. Blood Loss (mL): 300  Mom to postpartum.  Baby to Couplet care / Skin to Skin.  Hildred Lasernika [name] 10/28/2015, 1:53 PM

## 2015-08-27 ENCOUNTER — Ambulatory Visit (INDEPENDENT_AMBULATORY_CARE_PROVIDER_SITE_OTHER): Payer: Medicaid Other | Admitting: Obstetrics and Gynecology

## 2015-08-27 ENCOUNTER — Encounter: Payer: Self-pay | Admitting: Obstetrics and Gynecology

## 2015-08-27 VITALS — BP 97/60 | HR 104 | Wt 169.1 lb

## 2015-08-27 DIAGNOSIS — Z3483 Encounter for supervision of other normal pregnancy, third trimester: Secondary | ICD-10-CM

## 2015-08-27 DIAGNOSIS — Z23 Encounter for immunization: Secondary | ICD-10-CM | POA: Diagnosis not present

## 2015-08-27 DIAGNOSIS — Z3493 Encounter for supervision of normal pregnancy, unspecified, third trimester: Secondary | ICD-10-CM

## 2015-08-27 LAB — POCT URINALYSIS DIPSTICK
BILIRUBIN UA: NEGATIVE
GLUCOSE UA: NEGATIVE
Ketones, UA: NEGATIVE
Nitrite, UA: NEGATIVE
RBC UA: NEGATIVE
SPEC GRAV UA: 1.015
Urobilinogen, UA: 0.2
pH, UA: 7

## 2015-08-27 MED ORDER — TETANUS-DIPHTH-ACELL PERTUSSIS 5-2.5-18.5 LF-MCG/0.5 IM SUSP
0.5000 mL | Freq: Once | INTRAMUSCULAR | Status: AC
  Administered 2015-08-27: 0.5 mL via INTRAMUSCULAR

## 2015-08-27 MED ORDER — CONCEPT DHA 53.5-38-1 MG PO CAPS: 53.5000 mg | ORAL_CAPSULE | Freq: Every day | ORAL | Status: DC

## 2015-08-27 NOTE — Progress Notes (Signed)
Rob and tdap today. Glucola normal.

## 2015-09-10 ENCOUNTER — Ambulatory Visit (INDEPENDENT_AMBULATORY_CARE_PROVIDER_SITE_OTHER): Payer: Medicaid Other | Admitting: Obstetrics and Gynecology

## 2015-09-10 VITALS — BP 99/62 | HR 94 | Wt 177.1 lb

## 2015-09-10 DIAGNOSIS — Z23 Encounter for immunization: Secondary | ICD-10-CM | POA: Insufficient documentation

## 2015-09-10 DIAGNOSIS — Z3493 Encounter for supervision of normal pregnancy, unspecified, third trimester: Secondary | ICD-10-CM

## 2015-09-10 DIAGNOSIS — Z3483 Encounter for supervision of other normal pregnancy, third trimester: Secondary | ICD-10-CM

## 2015-09-10 LAB — POCT URINALYSIS DIPSTICK
Bilirubin, UA: NEGATIVE
Blood, UA: NEGATIVE
Glucose, UA: NEGATIVE
Ketones, UA: NEGATIVE
NITRITE UA: NEGATIVE
PH UA: 6.5
PROTEIN UA: NEGATIVE
Spec Grav, UA: 1.025
Urobilinogen, UA: NEGATIVE

## 2015-09-10 NOTE — Progress Notes (Signed)
ROB: Doing well, no complaints. Desires Depo, bottle feeding. Discussed cord blood banking.  Normal glucola.  RTC in 2 weeks.

## 2015-09-24 ENCOUNTER — Ambulatory Visit (INDEPENDENT_AMBULATORY_CARE_PROVIDER_SITE_OTHER): Payer: Medicaid Other | Admitting: Obstetrics and Gynecology

## 2015-09-24 VITALS — BP 98/66 | HR 103 | Wt 175.3 lb

## 2015-09-24 DIAGNOSIS — Z3493 Encounter for supervision of normal pregnancy, unspecified, third trimester: Secondary | ICD-10-CM

## 2015-09-24 DIAGNOSIS — O26813 Pregnancy related exhaustion and fatigue, third trimester: Secondary | ICD-10-CM

## 2015-09-24 LAB — POCT URINALYSIS DIPSTICK
Bilirubin, UA: NEGATIVE
GLUCOSE UA: NEGATIVE
Ketones, UA: NEGATIVE
NITRITE UA: NEGATIVE
PROTEIN UA: NEGATIVE
RBC UA: NEGATIVE
Spec Grav, UA: 1.01
UROBILINOGEN UA: NEGATIVE
pH, UA: 8

## 2015-09-26 NOTE — Progress Notes (Signed)
ROB: Patient notes fatigue and right abdominal soreness (likely secondary to fetal movement).  RTC in 1 week, for 36 week labs at that time.

## 2015-10-08 ENCOUNTER — Ambulatory Visit (INDEPENDENT_AMBULATORY_CARE_PROVIDER_SITE_OTHER): Payer: Medicaid Other | Admitting: Obstetrics and Gynecology

## 2015-10-08 ENCOUNTER — Encounter: Payer: Self-pay | Admitting: Obstetrics and Gynecology

## 2015-10-08 VITALS — BP 105/68 | HR 103 | Wt 173.8 lb

## 2015-10-08 DIAGNOSIS — O99013 Anemia complicating pregnancy, third trimester: Secondary | ICD-10-CM

## 2015-10-08 DIAGNOSIS — Z36 Encounter for antenatal screening of mother: Secondary | ICD-10-CM

## 2015-10-08 DIAGNOSIS — Z369 Encounter for antenatal screening, unspecified: Secondary | ICD-10-CM

## 2015-10-08 DIAGNOSIS — O2613 Low weight gain in pregnancy, third trimester: Secondary | ICD-10-CM

## 2015-10-08 DIAGNOSIS — O2610 Low weight gain in pregnancy, unspecified trimester: Secondary | ICD-10-CM | POA: Insufficient documentation

## 2015-10-08 DIAGNOSIS — Z3493 Encounter for supervision of normal pregnancy, unspecified, third trimester: Secondary | ICD-10-CM

## 2015-10-08 DIAGNOSIS — Z3483 Encounter for supervision of other normal pregnancy, third trimester: Secondary | ICD-10-CM

## 2015-10-08 DIAGNOSIS — Z113 Encounter for screening for infections with a predominantly sexual mode of transmission: Secondary | ICD-10-CM

## 2015-10-08 LAB — POCT URINALYSIS DIPSTICK
Bilirubin, UA: NEGATIVE
Blood, UA: NEGATIVE
GLUCOSE UA: NEGATIVE
KETONES UA: NEGATIVE
Leukocytes, UA: NEGATIVE
Nitrite, UA: NEGATIVE
SPEC GRAV UA: 1.01
Urobilinogen, UA: NEGATIVE
pH, UA: 7

## 2015-10-08 NOTE — Progress Notes (Signed)
ROB: Patient still noting fatigue and decreased appetite.  Has lost 2 more pounds. TWG for pregnancy in 9 lbs.  Denies symptoms of depression.   Is trying milkshakes.  Advised to add protein supplements to milkshakes.  36 week labs done today.

## 2015-10-09 LAB — HEP, RPR, HIV PANEL
HIV Screen 4th Generation wRfx: NONREACTIVE
Hepatitis B Surface Ag: NEGATIVE
RPR Ser Ql: NONREACTIVE

## 2015-10-09 LAB — HEMOGLOBIN AND HEMATOCRIT, BLOOD
Hematocrit: 28.6 % — ABNORMAL LOW (ref 34.0–46.6)
Hemoglobin: 9.9 g/dL — ABNORMAL LOW (ref 11.1–15.9)

## 2015-10-10 LAB — GC/CHLAMYDIA PROBE AMP
CHLAMYDIA, DNA PROBE: NEGATIVE
Neisseria gonorrhoeae by PCR: NEGATIVE

## 2015-10-12 LAB — CULTURE, BETA STREP (GROUP B ONLY): Strep Gp B Culture: NEGATIVE

## 2015-10-13 ENCOUNTER — Observation Stay
Admission: EM | Admit: 2015-10-13 | Discharge: 2015-10-13 | Disposition: A | Discharge status: Home / Self Care | Payer: Medicaid Other | Attending: Obstetrics and Gynecology | Admitting: Obstetrics and Gynecology

## 2015-10-13 ENCOUNTER — Encounter: Payer: Self-pay | Admitting: *Deleted

## 2015-10-13 ENCOUNTER — Telehealth: Payer: Self-pay

## 2015-10-13 DIAGNOSIS — Z3A37 37 weeks gestation of pregnancy: Secondary | ICD-10-CM | POA: Insufficient documentation

## 2015-10-13 DIAGNOSIS — D649 Anemia, unspecified: Secondary | ICD-10-CM | POA: Insufficient documentation

## 2015-10-13 DIAGNOSIS — O99013 Anemia complicating pregnancy, third trimester: Secondary | ICD-10-CM | POA: Diagnosis not present

## 2015-10-13 DIAGNOSIS — Z3490 Encounter for supervision of normal pregnancy, unspecified, unspecified trimester: Secondary | ICD-10-CM

## 2015-10-13 DIAGNOSIS — O479 False labor, unspecified: Secondary | ICD-10-CM | POA: Diagnosis present

## 2015-10-13 DIAGNOSIS — O2343 Unspecified infection of urinary tract in pregnancy, third trimester: Secondary | ICD-10-CM | POA: Diagnosis not present

## 2015-10-13 LAB — URINALYSIS COMPLETE WITH MICROSCOPIC (ARMC ONLY)
BILIRUBIN URINE: NEGATIVE
GLUCOSE, UA: NEGATIVE mg/dL
Ketones, ur: NEGATIVE mg/dL
Nitrite: NEGATIVE
Protein, ur: 30 mg/dL — AB
SPECIFIC GRAVITY, URINE: 1.013 (ref 1.005–1.030)
pH: 6 (ref 5.0–8.0)

## 2015-10-13 LAB — CBC
HEMATOCRIT: 29.6 % — AB (ref 35.0–47.0)
Hemoglobin: 9.9 g/dL — ABNORMAL LOW (ref 12.0–16.0)
MCH: 27.6 pg (ref 26.0–34.0)
MCHC: 33.4 g/dL (ref 32.0–36.0)
MCV: 82.6 fL (ref 80.0–100.0)
Platelets: 217 10*3/uL (ref 150–440)
RBC: 3.59 MIL/uL — AB (ref 3.80–5.20)
RDW: 14.2 % (ref 11.5–14.5)
WBC: 6.6 10*3/uL (ref 3.6–11.0)

## 2015-10-13 LAB — ABO/RH: ABO/RH(D): B POS

## 2015-10-13 LAB — TYPE AND SCREEN
ABO/RH(D): B POS
Antibody Screen: NEGATIVE

## 2015-10-13 MED ORDER — TERBUTALINE SULFATE 1 MG/ML IJ SOLN
0.2500 mg | Freq: Once | INTRAMUSCULAR | Status: AC
  Administered 2015-10-13: 0.25 mg via SUBCUTANEOUS
  Filled 2015-10-13: qty 1

## 2015-10-13 MED ORDER — DEXTROSE 5 % IV SOLN
1.0000 g | Freq: Once | INTRAVENOUS | Status: AC
  Administered 2015-10-13: 1 g via INTRAVENOUS
  Filled 2015-10-13: qty 10

## 2015-10-13 MED ORDER — FERROUS SULFATE 325 (65 FE) MG PO TABS: 325.0000 mg | ORAL_TABLET | Freq: Every day | ORAL | Status: DC

## 2015-10-13 MED ORDER — LACTATED RINGERS IV SOLN
INTRAVENOUS | Status: DC
  Administered 2015-10-13: 05:00:00 via INTRAVENOUS

## 2015-10-13 MED ORDER — ACETAMINOPHEN-CODEINE 120-12 MG/5ML PO SOLN
ORAL | Status: AC
  Filled 2015-10-13: qty 2

## 2015-10-13 MED ORDER — ACETAMINOPHEN-CODEINE #3 300-30 MG PO TABS
2.0000 | ORAL_TABLET | Freq: Once | ORAL | Status: AC
Start: 2015-10-13 — End: 2015-10-13
  Administered 2015-10-13: 2 via ORAL
  Filled 2015-10-13: qty 2

## 2015-10-13 MED ORDER — NITROFURANTOIN MONOHYD MACRO 100 MG PO CAPS: 100.0000 mg | ORAL_CAPSULE | Freq: Two times a day (BID) | ORAL | Status: DC

## 2015-10-13 MED ORDER — LACTATED RINGERS IV BOLUS (SEPSIS)
1000.0000 mL | Freq: Once | INTRAVENOUS | Status: AC
  Administered 2015-10-13: 1000 mL via INTRAVENOUS

## 2015-10-13 MED ORDER — FERROUS SULFATE 325 (65 FE) MG PO TABS: 325.0000 mg | ORAL_TABLET | Freq: Two times a day (BID) | ORAL | Status: DC

## 2015-10-13 NOTE — Discharge Summary (Signed)
Patient discharged with instructions on follow up appointments, pre term labor precautions, and when to seek medical attention. Patient ambulatory at discharge with steady gait and no complaints.

## 2015-10-13 NOTE — Discharge Instructions (Signed)
Your provider has diagnosed you with a urinary tract infection. Take antibiotics as prescribed.   Drink plenty of fluids   Return to hospital if you have any:   Leaking of fluid Bleeding Decreased fetal movement  Follow up with [facility] during office hours with any questions or concerns.

## 2015-10-13 NOTE — OB Triage Note (Signed)
G3P2 at [redacted]w[redacted]d presents with c/o contractions. No LOF, bleeding. +FM.

## 2015-10-13 NOTE — Telephone Encounter (Signed)
Called pt LM for her informing her of anemia and the need for iron supplement BID. RX sent in.

## 2015-10-13 NOTE — Telephone Encounter (Signed)
-----   Message from Hildred Laser, MD sent at 10/12/2015  6:24 PM EST ----- Please advise patient that anemia has slightly worsened.  Strongly encourage iron tablets BID.

## 2015-10-13 NOTE — Final Progress Note (Signed)
L&D OB Triage Note  HPI:  DENAYA HORN is a 30 y.o. G50P2002 female at [redacted]w[redacted]d. Estimated Date of Delivery: 10/31/15 who presents for contractions, q 5-7 minutes.  Onset was at 8:00 p.m. last night. Denies LOF, vaginal bleeding, and notes good FM.    OB History  Gravida Para Term Preterm AB SAB TAB Ectopic Multiple Living  # Outcome Date GA Lbr Len/2nd Weight Sex Delivery Anes PTL Lv  3 Current           2 Term 2010 [redacted]w[redacted]d  7 lb 4.8 oz (3.311 kg) M Vag-Spont   Y     Complications: Oligohydramnios  1 Term 2005 [redacted]w[redacted]d  6 lb (2.722 kg) M Vag-Spont   Y     Complications: Oligohydramnios    Obstetric Comments  Induction for oligohydramnios both pregnancies     Past Medical History  Diagnosis Date  . Nausea and vomiting during pregnancy   . History of gonorrhea 2011  . Bacterial vaginal infection   . Flu vaccine need     at 16 weeks  . Need for Tdap vaccination     at 28 weeks  . Marijuana use     last used 02/2015    Past Surgical History  Procedure Laterality Date  . No past surgeries     Social History  Substance Use Topics  . Smoking status: Never Smoker   . Smokeless tobacco: None  . Alcohol Use: Yes     Comment: occas    No current facility-administered medications on file prior to encounter.   Current Outpatient Prescriptions on File Prior to Encounter  Medication Sig Dispense Refill  . Prenat-FeFum-FePo-FA-Omega 3 (CONCEPT DHA) 53.5-38-1 MG CAPS Take 53.5 mg by mouth daily. 30 capsule 11    No Known Allergies  ROS:  Review of Systems - Negative except for what is noted in HPI.   Physical Exam:  Blood pressure 97/67, pulse 92, temperature 97.8 F (36.6 C), temperature source Oral, resp. rate 16, last menstrual period 01/24/2015. General appearance: alert and mild distress Abdomen: soft, non-tender; bowel sounds normal; no masses,  no organomegaly. Gravid Pelvic: external genitalia normal and vagina normal without discharge.  Cervix 4/70/-3  (changed from 3/50/-3) after initial check, however no further change. Extremities: extremities normal, atraumatic, no cyanosis or edema   FETAL SURVEILLANCE TESTING SUMMARY  INDICATIONS: rule out uterine contractions  Mode: External Baseline Rate (A): 135 bpm Variability: Moderate Accelerations: 15 x 15 Decelerations: None     Contraction Frequency (min): 2-5  Fetal surveillance: reassuring   Labs:  Results for orders placed or performed during the hospital encounter of 10/13/15  Urinalysis complete, with microscopic ([facility] only)  Result Value Ref Range   Color, Urine YELLOW (A) YELLOW   APPearance TURBID (A) CLEAR   Glucose, UA NEGATIVE NEGATIVE mg/dL   Bilirubin Urine NEGATIVE NEGATIVE   Ketones, ur NEGATIVE NEGATIVE mg/dL   Specific Gravity, Urine 1.013 1.005 - 1.030   Hgb urine dipstick 2+ (A) NEGATIVE   pH 6.0 5.0 - 8.0   Protein, ur 30 (A) NEGATIVE mg/dL   Nitrite NEGATIVE NEGATIVE   Leukocytes, UA 3+ (A) NEGATIVE   RBC / HPF 6-30 0 - 5 RBC/hpf   WBC, UA TOO NUMEROUS TO COUNT 0 - 5 WBC/hpf   Bacteria, UA FEW (A) NONE SEEN   Squamous Epithelial / LPF TOO NUMEROUS TO COUNT (A) NONE SEEN   WBC  Clumps PRESENT    Mucous PRESENT   CBC  Result Value Ref Range   WBC 6.6 3.6 - 11.0 K/uL   RBC 3.59 (L) 3.80 - 5.20 MIL/uL   Hemoglobin 9.9 (L) 12.0 - 16.0 g/dL   HCT 16.1 (L) 09.6 - 04.5 %   MCV 82.6 80.0 - 100.0 fL   MCH 27.6 26.0 - 34.0 pg   MCHC 33.4 32.0 - 36.0 g/dL   RDW 40.9 81.1 - 91.4 %   Platelets 217 150 - 440 K/uL    Assessment:  30 y.o. G3P2002 at [redacted]w[redacted]d with:  1.  Suspected UTI 2. Latent labor 3. Mild anemia of pregnancy  Plan:  1. Given IVF bolus followed by LR at 125 ml/hr.  Also given dose of IV Rocephin 1 gram for suspected UTI.  Will treat with Macrobid outpatient.  2. No further cervical change.  Given T#3 overnight for contractions, patient still notes moderate pain with contractions.  Contractions have become more irregular after IVF to q  2-7 min. Given dose of terbutaline x 1 for discomfort.  3. Will treat with iron PO for mild anemia  4. Can d/c home with labor precautions.    Hildred Laser, MD [facility]

## 2015-10-14 LAB — RPR: RPR: NONREACTIVE

## 2015-10-15 ENCOUNTER — Ambulatory Visit (INDEPENDENT_AMBULATORY_CARE_PROVIDER_SITE_OTHER): Payer: Medicaid Other | Admitting: Obstetrics and Gynecology

## 2015-10-15 ENCOUNTER — Encounter: Payer: Self-pay | Admitting: Obstetrics and Gynecology

## 2015-10-15 VITALS — BP 107/69 | HR 96 | Wt 176.4 lb

## 2015-10-15 DIAGNOSIS — O09293 Supervision of pregnancy with other poor reproductive or obstetric history, third trimester: Secondary | ICD-10-CM

## 2015-10-15 DIAGNOSIS — O2340 Unspecified infection of urinary tract in pregnancy, unspecified trimester: Secondary | ICD-10-CM | POA: Insufficient documentation

## 2015-10-15 DIAGNOSIS — O09299 Supervision of pregnancy with other poor reproductive or obstetric history, unspecified trimester: Secondary | ICD-10-CM | POA: Insufficient documentation

## 2015-10-15 DIAGNOSIS — O2343 Unspecified infection of urinary tract in pregnancy, third trimester: Secondary | ICD-10-CM

## 2015-10-15 DIAGNOSIS — Z3483 Encounter for supervision of other normal pregnancy, third trimester: Secondary | ICD-10-CM

## 2015-10-15 DIAGNOSIS — Z3493 Encounter for supervision of normal pregnancy, unspecified, third trimester: Secondary | ICD-10-CM

## 2015-10-15 LAB — POCT URINALYSIS DIPSTICK
Bilirubin, UA: NEGATIVE
Glucose, UA: NEGATIVE
KETONES UA: NEGATIVE
Nitrite, UA: NEGATIVE
Spec Grav, UA: 1.02
Urobilinogen, UA: NEGATIVE
pH, UA: 6

## 2015-10-15 NOTE — Progress Notes (Signed)
ROB: Denies complaints today.  Seen in triage on 2/27 for contractions, was diagnosed with UTI and latent labor.  No further change after 4 cm.  Is compliant with macrobid.  Has gained 3 lbs since last visit.  Patient notes h/o oligohydramnios in both prior pregnancies, requiring IOL at 38 weeks.  Will check AFI and growth within next week.  RTC in 1 week. Labor precautions given.

## 2015-10-22 ENCOUNTER — Ambulatory Visit (INDEPENDENT_AMBULATORY_CARE_PROVIDER_SITE_OTHER): Payer: Medicaid Other | Admitting: Obstetrics and Gynecology

## 2015-10-22 ENCOUNTER — Encounter: Payer: Self-pay | Admitting: Obstetrics and Gynecology

## 2015-10-22 ENCOUNTER — Ambulatory Visit (INDEPENDENT_AMBULATORY_CARE_PROVIDER_SITE_OTHER): Payer: Medicaid Other

## 2015-10-22 VITALS — BP 113/73 | HR 82 | Wt 178.4 lb

## 2015-10-22 DIAGNOSIS — Z3483 Encounter for supervision of other normal pregnancy, third trimester: Secondary | ICD-10-CM

## 2015-10-22 DIAGNOSIS — O2343 Unspecified infection of urinary tract in pregnancy, third trimester: Secondary | ICD-10-CM

## 2015-10-22 DIAGNOSIS — Z3493 Encounter for supervision of normal pregnancy, unspecified, third trimester: Secondary | ICD-10-CM

## 2015-10-22 DIAGNOSIS — O09293 Supervision of pregnancy with other poor reproductive or obstetric history, third trimester: Secondary | ICD-10-CM | POA: Diagnosis not present

## 2015-10-22 LAB — POCT URINALYSIS DIPSTICK
Bilirubin, UA: NEGATIVE
Blood, UA: NEGATIVE
Glucose, UA: NEGATIVE
Ketones, UA: NEGATIVE
LEUKOCYTES UA: NEGATIVE
NITRITE UA: NEGATIVE
PH UA: 7.5
PROTEIN UA: NEGATIVE
Spec Grav, UA: 1.01
UROBILINOGEN UA: NEGATIVE

## 2015-10-22 NOTE — Progress Notes (Signed)
ROB: Patient denies complaints.  Growth scan with fetus at 42ndpercentile, Placenta: Anterior and grade 3. AFI: Low normal at 7.7 cm.  Can continue pregnancy (does not require IOL at 38 weeks as in prior pregnancies). Labor precautions, kick counts discussed.  RTC in 1 week.

## 2015-10-23 ENCOUNTER — Inpatient Hospital Stay
Admission: EM | Admit: 2015-10-23 | Discharge: 2015-10-23 | Disposition: A | Discharge status: Home / Self Care | Payer: Medicaid Other | Attending: Obstetrics and Gynecology | Admitting: Obstetrics and Gynecology

## 2015-10-23 DIAGNOSIS — O4292 Full-term premature rupture of membranes, unspecified as to length of time between rupture and onset of labor: Secondary | ICD-10-CM | POA: Diagnosis present

## 2015-10-23 DIAGNOSIS — Z3A38 38 weeks gestation of pregnancy: Secondary | ICD-10-CM | POA: Insufficient documentation

## 2015-10-23 DIAGNOSIS — Z87440 Personal history of urinary (tract) infections: Secondary | ICD-10-CM | POA: Insufficient documentation

## 2015-10-23 DIAGNOSIS — O26893 Other specified pregnancy related conditions, third trimester: Secondary | ICD-10-CM | POA: Diagnosis not present

## 2015-10-23 DIAGNOSIS — O99013 Anemia complicating pregnancy, third trimester: Secondary | ICD-10-CM | POA: Insufficient documentation

## 2015-10-23 LAB — URINE CULTURE

## 2015-10-23 NOTE — Progress Notes (Signed)
Off monitor.  Up to dress for discharge.  

## 2015-10-23 NOTE — Progress Notes (Signed)
Maternal movement and change of position noted.  Readjusted US and toco. Will continue on side for monitoring.

## 2015-10-23 NOTE — Progress Notes (Signed)
Discharge instructions given and explained.  Verbalized understanding.  Signed copy on chart and one in hand.  Discharged in stable and ambulatory condition.  

## 2015-10-23 NOTE — Final Progress Note (Signed)
L&D OB Triage Note  HPI:  Samantha Heath is a 30 y.o. G45P2002 female at [redacted]w[redacted]d. Estimated Date of Delivery: 10/31/15 who presents for complaints of leakage of fluids since yesterday.  Patient notes that she had a small trickle of clear fluid yesterday, and none since then until this morning where she had another trickle. Does note occasional contractions, mild.    OB History  Gravida Para Term Preterm AB SAB TAB Ectopic Multiple Living  # Outcome Date GA Lbr Len/2nd Weight Sex Delivery Anes PTL Lv  3 Current           2 Term 2010 [redacted]w[redacted]d  7 lb 4.8 oz (3.311 kg) M Vag-Spont   Y     Complications: Oligohydramnios  1 Term 2005 [redacted]w[redacted]d  6 lb (2.722 kg) M Vag-Spont   Y     Complications: Oligohydramnios    Obstetric Comments  Induction for oligohydramnios both pregnancies     Patient Active Problem List   Diagnosis Date Noted  . UTI in pregnancy 10/15/2015  . H/O oligohydramnios in prior pregnancy, currently pregnant 10/15/2015  . Pregnancy 10/13/2015  . Irregular uterine contractions 10/13/2015  . UTI (urinary tract infection) in pregnancy in third trimester 10/13/2015  . Anemia of pregnancy in third trimester 10/13/2015  . Poor weight gain of pregnancy 10/08/2015  . Flu vaccine need 09/10/2015  . Supervision of normal pregnancy in third trimester 07/06/2015  . Marijuana abuse 05/23/2015    Past Medical History  Diagnosis Date  . Nausea and vomiting during pregnancy   . History of gonorrhea 2011  . Bacterial vaginal infection   . Flu vaccine need     at 16 weeks  . Need for Tdap vaccination     at 28 weeks  . Marijuana use     last used 02/2015    No current facility-administered medications on file prior to encounter.   Current Outpatient Prescriptions on File Prior to Encounter  Medication Sig Dispense Refill  . ferrous sulfate 325 (65 FE) MG tablet Take 1 tablet (325 mg total) by mouth 2 (two) times daily with a meal. 60 tablet 3  .  Prenat-FeFum-FePo-FA-Omega 3 (CONCEPT DHA) 53.5-38-1 MG CAPS Take 53.5 mg by mouth daily. 30 capsule 11    No Known Allergies   ROS:  Review of Systems - Denies vaginal bleeding, and decreased fetal movement.  All other systems negative.    Physical Exam:  Blood pressure 106/65, pulse 95, temperature 98.1 F (36.7 C), temperature source Oral, resp. rate 20, last menstrual period 01/24/2015. General appearance: alert and no distress Abdomen: soft, non-tender; bowel sounds normal; no masses,  no organomegaly.  Gravid Pelvic: external genitalia normal.  Cervix 4/60-70/-2/c.  Ferning slide and nitrazine test negative.  Extremities: extremities normal, atraumatic, no cyanosis or edema   NST INTERPRETATION: Indications: rule out uterine contractions  Mode: External Baseline Rate (A): 135 bpm Variability: Moderate Accelerations: 15 x 15 Decelerations: 1 spontaneous deceleration from baseline to 90s, lasting 2 minutes (@ 7:45 a.m.)     Contraction Frequency (min): occas (q 10-15 min)  Impression: Category II tracing   Assessment:  30 y.o. G3P2002 at [redacted]w[redacted]d with:  1.  Low normal AFI (AFI 7.7 cm on yesterday's scan) with h/o oligohydramnios in prior pregnancies.  No evidence of LOF today.  2. Category II tracing   Plan:  1. Extended fetal monitoring performed with no further decelerations. Overall reassuring.  2. Labor precautions given.  Patient to f/u with OB at next regularly scheduled appointment.    Hildred LaserAnika Mickala Laton, MD [facility]

## 2015-10-28 ENCOUNTER — Inpatient Hospital Stay: Payer: Medicaid Other | Admitting: Certified Registered Nurse Anesthetist

## 2015-10-28 ENCOUNTER — Inpatient Hospital Stay
Admission: EM | Admit: 2015-10-28 | Discharge: 2015-10-29 | DRG: 775 | Disposition: A | Discharge status: Home / Self Care | Payer: Medicaid Other | Attending: Obstetrics and Gynecology | Admitting: Obstetrics and Gynecology

## 2015-10-28 DIAGNOSIS — Z3A39 39 weeks gestation of pregnancy: Secondary | ICD-10-CM | POA: Diagnosis not present

## 2015-10-28 DIAGNOSIS — O9902 Anemia complicating childbirth: Secondary | ICD-10-CM | POA: Diagnosis present

## 2015-10-28 DIAGNOSIS — Z3493 Encounter for supervision of normal pregnancy, unspecified, third trimester: Secondary | ICD-10-CM | POA: Diagnosis not present

## 2015-10-28 DIAGNOSIS — Z79899 Other long term (current) drug therapy: Secondary | ICD-10-CM | POA: Diagnosis not present

## 2015-10-28 HISTORY — DX: Personal history of other infectious and parasitic diseases: Z86.19

## 2015-10-28 LAB — CBC
HEMATOCRIT: 28.6 % — AB (ref 35.0–47.0)
Hemoglobin: 9.6 g/dL — ABNORMAL LOW (ref 12.0–16.0)
MCH: 27.6 pg (ref 26.0–34.0)
MCHC: 33.6 g/dL (ref 32.0–36.0)
MCV: 82.2 fL (ref 80.0–100.0)
Platelets: 201 10*3/uL (ref 150–440)
RBC: 3.48 MIL/uL — AB (ref 3.80–5.20)
RDW: 14.5 % (ref 11.5–14.5)
WBC: 9 10*3/uL (ref 3.6–11.0)

## 2015-10-28 LAB — TYPE AND SCREEN
ABO/RH(D): B POS
Antibody Screen: NEGATIVE

## 2015-10-28 MED ORDER — OXYTOCIN 40 UNITS IN LACTATED RINGERS INFUSION - SIMPLE MED: 1.0000 m[IU]/min | INTRAVENOUS | Status: DC

## 2015-10-28 MED ORDER — NALBUPHINE HCL 10 MG/ML IJ SOLN: 5.0000 mg | INTRAMUSCULAR | Status: DC | PRN

## 2015-10-28 MED ORDER — OXYCODONE-ACETAMINOPHEN 5-325 MG PO TABS
1.0000 | ORAL_TABLET | ORAL | Status: DC | PRN
Start: 2015-10-28 — End: 2015-10-28

## 2015-10-28 MED ORDER — DIPHENHYDRAMINE HCL 25 MG PO CAPS: 25.0000 mg | ORAL_CAPSULE | Freq: Four times a day (QID) | ORAL | Status: DC | PRN

## 2015-10-28 MED ORDER — LIDOCAINE HCL (PF) 1 % IJ SOLN
INTRAMUSCULAR | Status: DC | PRN
  Administered 2015-10-28: 3 mL

## 2015-10-28 MED ORDER — LACTATED RINGERS IV SOLN
500.0000 mL | INTRAVENOUS | Status: DC | PRN
  Administered 2015-10-28: 1000 mL via INTRAVENOUS

## 2015-10-28 MED ORDER — TETANUS-DIPHTH-ACELL PERTUSSIS 5-2.5-18.5 LF-MCG/0.5 IM SUSP
0.5000 mL | Freq: Once | INTRAMUSCULAR | Status: AC
  Administered 2015-10-29: 0.5 mL via INTRAMUSCULAR
  Filled 2015-10-28: qty 0.5

## 2015-10-28 MED ORDER — NALBUPHINE HCL 10 MG/ML IJ SOLN: 5.0000 mg | Freq: Once | INTRAMUSCULAR | Status: DC | PRN

## 2015-10-28 MED ORDER — ONDANSETRON HCL 4 MG/2ML IJ SOLN: 4.0000 mg | Freq: Four times a day (QID) | INTRAMUSCULAR | Status: DC | PRN

## 2015-10-28 MED ORDER — MISOPROSTOL 200 MCG PO TABS
ORAL_TABLET | ORAL | Status: AC
  Filled 2015-10-28: qty 4

## 2015-10-28 MED ORDER — LANOLIN HYDROUS EX OINT: TOPICAL_OINTMENT | CUTANEOUS | Status: DC | PRN

## 2015-10-28 MED ORDER — KETOROLAC TROMETHAMINE 30 MG/ML IJ SOLN: 30.0000 mg | Freq: Four times a day (QID) | INTRAMUSCULAR | Status: DC | PRN

## 2015-10-28 MED ORDER — EPHEDRINE SULFATE 50 MG/ML IJ SOLN
INTRAMUSCULAR | Status: DC | PRN
  Administered 2015-10-28: 5 mg via INTRAVENOUS

## 2015-10-28 MED ORDER — SENNOSIDES-DOCUSATE SODIUM 8.6-50 MG PO TABS
2.0000 | ORAL_TABLET | ORAL | Status: DC
  Administered 2015-10-28 – 2015-10-29 (×2): 2 via ORAL
  Filled 2015-10-28 (×2): qty 2

## 2015-10-28 MED ORDER — BUTORPHANOL TARTRATE 1 MG/ML IJ SOLN
1.0000 mg | INTRAMUSCULAR | Status: DC | PRN
  Administered 2015-10-28 (×2): 1 mg via INTRAVENOUS
  Filled 2015-10-28 (×2): qty 1

## 2015-10-28 MED ORDER — ZOLPIDEM TARTRATE 5 MG PO TABS: 5.0000 mg | ORAL_TABLET | Freq: Every evening | ORAL | Status: DC | PRN

## 2015-10-28 MED ORDER — ONDANSETRON HCL 4 MG/2ML IJ SOLN: 4.0000 mg | INTRAMUSCULAR | Status: DC | PRN

## 2015-10-28 MED ORDER — LACTATED RINGERS IV SOLN
INTRAVENOUS | Status: DC
  Administered 2015-10-28: 05:00:00 via INTRAVENOUS

## 2015-10-28 MED ORDER — NALOXONE HCL 2 MG/2ML IJ SOSY
1.0000 ug/kg/h | PREFILLED_SYRINGE | INTRAVENOUS | Status: DC | PRN
  Filled 2015-10-28: qty 2

## 2015-10-28 MED ORDER — WITCH HAZEL-GLYCERIN EX PADS: 1.0000 "application " | MEDICATED_PAD | CUTANEOUS | Status: DC | PRN

## 2015-10-28 MED ORDER — ACETAMINOPHEN 325 MG PO TABS: 650.0000 mg | ORAL_TABLET | ORAL | Status: DC | PRN

## 2015-10-28 MED ORDER — CITRIC ACID-SODIUM CITRATE 334-500 MG/5ML PO SOLN: 30.0000 mL | ORAL | Status: DC | PRN

## 2015-10-28 MED ORDER — ACETAMINOPHEN 325 MG PO TABS
650.0000 mg | ORAL_TABLET | ORAL | Status: DC | PRN
  Administered 2015-10-28: 650 mg via ORAL
  Filled 2015-10-28: qty 1
  Filled 2015-10-28: qty 2

## 2015-10-28 MED ORDER — SODIUM CHLORIDE 0.9% FLUSH: 3.0000 mL | INTRAVENOUS | Status: DC | PRN

## 2015-10-28 MED ORDER — TERBUTALINE SULFATE 1 MG/ML IJ SOLN: 0.2500 mg | Freq: Once | INTRAMUSCULAR | Status: DC | PRN

## 2015-10-28 MED ORDER — DIPHENHYDRAMINE HCL 50 MG/ML IJ SOLN: 12.5000 mg | INTRAMUSCULAR | Status: DC | PRN

## 2015-10-28 MED ORDER — IBUPROFEN 800 MG PO TABS
800.0000 mg | ORAL_TABLET | Freq: Four times a day (QID) | ORAL | Status: DC
  Administered 2015-10-28 – 2015-10-29 (×3): 800 mg via ORAL
  Filled 2015-10-28 (×3): qty 1

## 2015-10-28 MED ORDER — AMMONIA AROMATIC IN INHA
RESPIRATORY_TRACT | Status: AC
  Filled 2015-10-28: qty 10

## 2015-10-28 MED ORDER — DIPHENHYDRAMINE HCL 25 MG PO CAPS: 25.0000 mg | ORAL_CAPSULE | ORAL | Status: DC | PRN

## 2015-10-28 MED ORDER — ONDANSETRON HCL 4 MG/2ML IJ SOLN: 4.0000 mg | Freq: Three times a day (TID) | INTRAMUSCULAR | Status: DC | PRN

## 2015-10-28 MED ORDER — NALOXONE HCL 0.4 MG/ML IJ SOLN: 0.4000 mg | INTRAMUSCULAR | Status: DC | PRN

## 2015-10-28 MED ORDER — EPHEDRINE 5 MG/ML INJ
INTRAVENOUS | Status: AC
  Filled 2015-10-28: qty 10

## 2015-10-28 MED ORDER — OXYTOCIN 40 UNITS IN LACTATED RINGERS INFUSION - SIMPLE MED
2.5000 [IU]/h | INTRAVENOUS | Status: DC
  Administered 2015-10-28: 39.96 [IU]/h via INTRAVENOUS
  Filled 2015-10-28: qty 1000

## 2015-10-28 MED ORDER — BUPIVACAINE HCL (PF) 0.25 % IJ SOLN
INTRAMUSCULAR | Status: DC | PRN
  Administered 2015-10-28 (×2): 4 mL via EPIDURAL

## 2015-10-28 MED ORDER — PRENATAL MULTIVITAMIN CH
1.0000 | ORAL_TABLET | Freq: Every day | ORAL | Status: DC
  Administered 2015-10-29: 1 via ORAL
  Filled 2015-10-28: qty 1

## 2015-10-28 MED ORDER — LIDOCAINE HCL (PF) 1 % IJ SOLN: 30.0000 mL | INTRAMUSCULAR | Status: DC | PRN

## 2015-10-28 MED ORDER — OXYCODONE-ACETAMINOPHEN 5-325 MG PO TABS: 2.0000 | ORAL_TABLET | ORAL | Status: DC | PRN

## 2015-10-28 MED ORDER — OXYTOCIN BOLUS FROM INFUSION: 500.0000 mL | INTRAVENOUS | Status: DC

## 2015-10-28 MED ORDER — LIDOCAINE-EPINEPHRINE (PF) 1.5 %-1:200000 IJ SOLN
INTRAMUSCULAR | Status: DC | PRN
  Administered 2015-10-28: 3 mL via EPIDURAL

## 2015-10-28 MED ORDER — DIBUCAINE 1 % RE OINT: 1.0000 "application " | TOPICAL_OINTMENT | RECTAL | Status: DC | PRN

## 2015-10-28 MED ORDER — ONDANSETRON HCL 4 MG PO TABS: 4.0000 mg | ORAL_TABLET | ORAL | Status: DC | PRN

## 2015-10-28 MED ORDER — SIMETHICONE 80 MG PO CHEW: 80.0000 mg | CHEWABLE_TABLET | ORAL | Status: DC | PRN

## 2015-10-28 MED ORDER — BENZOCAINE-MENTHOL 20-0.5 % EX AERO: 1.0000 "application " | INHALATION_SPRAY | CUTANEOUS | Status: DC | PRN

## 2015-10-28 MED ORDER — OXYTOCIN 10 UNIT/ML IJ SOLN
INTRAMUSCULAR | Status: AC
  Filled 2015-10-28: qty 2

## 2015-10-28 MED ORDER — MEPERIDINE HCL 25 MG/ML IJ SOLN: 6.2500 mg | INTRAMUSCULAR | Status: DC | PRN

## 2015-10-28 MED ORDER — FERROUS SULFATE 325 (65 FE) MG PO TABS
325.0000 mg | ORAL_TABLET | Freq: Two times a day (BID) | ORAL | Status: DC
  Administered 2015-10-28 – 2015-10-29 (×2): 325 mg via ORAL
  Filled 2015-10-28 (×2): qty 1

## 2015-10-28 MED ORDER — FENTANYL 2.5 MCG/ML W/ROPIVACAINE 0.2% IN NS 100 ML EPIDURAL INFUSION (ARMC-ANES): 10.0000 mL/h | EPIDURAL | Status: DC

## 2015-10-28 MED ORDER — FENTANYL 2.5 MCG/ML W/ROPIVACAINE 0.2% IN NS 100 ML EPIDURAL INFUSION (ARMC-ANES)
EPIDURAL | Status: AC
  Administered 2015-10-28: 10 mL/h via EPIDURAL
  Filled 2015-10-28: qty 100

## 2015-10-28 MED ORDER — LIDOCAINE HCL (PF) 1 % IJ SOLN
INTRAMUSCULAR | Status: AC
  Filled 2015-10-28: qty 30

## 2015-10-28 NOTE — Anesthesia Preprocedure Evaluation (Signed)
Anesthesia Evaluation  Patient identified by MRN, date of birth, ID band Patient awake    Reviewed: Allergy & Precautions, H&P , NPO status , Patient's Chart, lab work & pertinent test results  History of Anesthesia Complications Negative for: history of anesthetic complications  Airway Mallampati: II  TM Distance: >3 FB Neck ROM: full    Dental no notable dental hx.    Pulmonary neg pulmonary ROS,    Pulmonary exam normal        Cardiovascular Exercise Tolerance: Good negative cardio ROS Normal cardiovascular exam     Neuro/Psych    GI/Hepatic Neg liver ROS, Controlled,  Endo/Other  negative endocrine ROS  Renal/GU negative Renal ROS     Musculoskeletal   Abdominal   Peds  Hematology  (+) anemia ,   Anesthesia Other Findings   Reproductive/Obstetrics (+) Pregnancy                             Anesthesia Physical Anesthesia Plan  ASA: II  Anesthesia Plan: Epidural   Post-op Pain Management:    Induction:   Airway Management Planned:   Additional Equipment:   Intra-op Plan:   Post-operative Plan:   Informed Consent: I have reviewed the patients History and Physical, chart, labs and discussed the procedure including the risks, benefits and alternatives for the proposed anesthesia with the patient or authorized representative who has indicated his/her understanding and acceptance.   Dental Advisory Given  Plan Discussed with: Anesthesiologist  Anesthesia Plan Comments:         Anesthesia Quick Evaluation

## 2015-10-28 NOTE — H&P (Signed)
Obstetric History and Physical  Betsy Priesamela L Roanna EpleyGarland is a 30 y.o. G3P2002 with IUP at 5238w4d presenting for ruptured membranes since ~ 3:30 a.m. Patient states she has been having  irregular, every 7-10 minutes contractions, none vaginal bleeding, ruptured, clear fluid membranes, with active fetal movement.    Prenatal Course Source of Care: [facility] with onset of care at 16 weeks Pregnancy complications or risks: Patient Active Problem List   Diagnosis Date Noted  . Labor and delivery, indication for care 10/28/2015  . UTI in pregnancy 10/15/2015  . H/O oligohydramnios in prior pregnancy, currently pregnant 10/15/2015  . Pregnancy 10/13/2015  . Irregular uterine contractions 10/13/2015  . UTI (urinary tract infection) in pregnancy in third trimester 10/13/2015  . Anemia of pregnancy in third trimester 10/13/2015  . Poor weight gain of pregnancy 10/08/2015  . Flu vaccine need 09/10/2015  . Supervision of normal pregnancy in third trimester 07/06/2015  . Marijuana abuse 05/23/2015   She plans to breastfeed She desires Depo-Provera for postpartum contraception.   Prenatal labs and studies: ABO, Rh: --/--/B POS (03/14 0149) Antibody: NEG (03/14 0149) Rubella: 6.30 (10/06 1306) RPR: Non Reactive (02/27 0430)  HBsAg: Negative (02/22 1159)  HIV: Non Reactive (02/22 1159)  GBS: Negative (02/22 1159) 1 hr Glucola  normal Genetic screening normal Anatomy US normal   Past Medical History  Diagnosis Date  . Nausea and vomiting during pregnancy   . History of gonorrhea 2011  . Bacterial vaginal infection   . Flu vaccine need     at 16 weeks  . Need for Tdap vaccination     at 28 weeks  . Marijuana use     last used 02/2015  . H/O gonorrhea     Past Surgical History  Procedure Laterality Date  . No past surgeries      OB History  Gravida Para Term Preterm AB SAB TAB Ectopic Multiple Living  3 2 2       2     # Outcome Date GA Lbr Len/2nd Weight Sex Delivery Anes  PTL Lv  3 Current           2 Term 2010 4152w0d  7 lb 4.8 oz (3.311 kg) M Vag-Spont   Y     Complications: Oligohydramnios  1 Term 2005 452w0d  6 lb (2.722 kg) M Vag-Spont   Y     Complications: Oligohydramnios    Obstetric Comments  Induction for oligohydramnios both pregnancies     Social History   Social History  . Marital Status: Single    Spouse Name: N/A  . Number of Children: N/A  . Years of Education: N/A   Social History Main Topics  . Smoking status: Never Smoker   . Smokeless tobacco: None  . Alcohol Use: Yes     Comment: occas   . Drug Use: Yes    Special: Marijuana     Comment: rare- last used 02/17/2015  . Sexual Activity: Yes    Birth Control/ Protection: None   Other Topics Concern  . None   Social History Narrative    Family History  Problem Relation Age of Onset  . Cancer Neg Hx   . Diabetes Neg Hx   . Heart disease Neg Hx     Prescriptions prior to admission  Medication Sig Dispense Refill Last Dose  . ferrous sulfate 325 (65 FE) MG tablet Take 1 tablet (325 mg total) by mouth 2 (two) times daily with a meal. 60  tablet 3 Past Month at Unknown time  . Prenat-FeFum-FePo-FA-Omega 3 (CONCEPT DHA) 53.5-38-1 MG CAPS Take 53.5 mg by mouth daily. 30 capsule 11 10/27/2015 at Unknown time    No Known Allergies  Review of Systems: Negative except for what is mentioned in HPI.  Physical Exam: BP 105/57 mmHg  Pulse 98  Temp(Src) 97.9 F (36.6 C) (Oral)  Resp 16  Ht  (1.778 m)  Wt 178 lb (80.74 kg)  BMI 25.54 kg/m2  LMP 01/24/2015 (Approximate) CONSTITUTIONAL: Well-developed, well-nourished female in no acute distress.  HENT:  Normocephalic, atraumatic, External right and left ear normal. Oropharynx is clear and moist EYES: Conjunctivae and EOM are normal. Pupils are equal, round, and reactive to light. No scleral icterus.  NECK: Normal range of motion, supple, no masses SKIN: Skin is warm and dry. No rash noted. Not diaphoretic. No erythema. No  pallor. NEUROLOGIC: Alert and oriented to person, place, and time. Normal reflexes, muscle tone coordination. No cranial nerve deficit noted. PSYCHIATRIC: Normal mood and affect. Normal behavior. Normal judgment and thought content. CARDIOVASCULAR: Normal heart rate noted, regular rhythm RESPIRATORY: Effort and breath sounds normal, no problems with respiration noted ABDOMEN: Soft, nontender, nondistended, gravid. MUSCULOSKELETAL: Normal range of motion. No edema and no tenderness. 2+ distal pulses.  Cervical Exam: Dilatation 4cm   Effacement 80%   Station -2   Presentation: cephalic FHT:  Baseline rate 145 bpm   Variability moderate  Accelerations present   Decelerations none Contractions: Every 3-5 mins   Pertinent Labs/Studies:   Results for orders placed or performed during the hospital encounter of 10/28/15 (from the past 24 hour(s))  Type and screen [facility]     Status: None   Collection Time: 10/28/15  1:49 AM  Result Value Ref Range   ABO/RH(D) B POS    Antibody Screen NEG    Sample Expiration 10/31/2015   CBC     Status: Abnormal   Collection Time: 10/28/15  4:18 AM  Result Value Ref Range   WBC 9.0 3.6 - 11.0 K/uL   RBC 3.48 (L) 3.80 - 5.20 MIL/uL   Hemoglobin 9.6 (L) 12.0 - 16.0 g/dL   HCT 40.9 (L) 81.1 - 91.4 %   MCV 82.2 80.0 - 100.0 fL   MCH 27.6 26.0 - 34.0 pg   MCHC 33.6 32.0 - 36.0 g/dL   RDW 78.2 95.6 - 21.3 %   Platelets 201 150 - 440 K/uL    Assessment : SENETRA DILLIN is a 30 y.o. G3P2002 at [redacted]w[redacted]d being admitted for PROM, latent labor. Anemia of pregnancy.  Plan: Labor: Active management.  Patient with no cervical change for 4-5 hrs since ruptured membranes. Augmentation as needed, per protocol with Pitocin. FWB: Reassuring fetal heart tracing.  GBS negative Delivery plan: Hopeful for vaginal delivery.   Hildred Laser, MD [facility]

## 2015-10-28 NOTE — Anesthesia Procedure Notes (Signed)
Epidural Patient location during procedure: OB Start time: 10/28/2015 8:37 AM End time: 10/28/2015 8:55 AM  Staffing Anesthesiologist: Lenard SimmerKARENZ, ANDREW Resident/CRNA: Ginger CarneMICHELET, Maeley Matton Performed by: resident/CRNA   Preanesthetic Checklist Completed: patient identified, site marked, surgical consent, pre-op evaluation, timeout performed, IV checked, risks and benefits discussed and monitors and equipment checked  Epidural Patient position: sitting Prep: Betadine Patient monitoring: heart rate, continuous pulse ox and blood pressure Approach: midline Location: L4-L5 Injection technique: LOR saline  Needle:  Needle type: Tuohy  Needle gauge: 17 G Needle length: 9 cm and 9 Needle insertion depth: 8 cm Catheter type: closed end flexible Catheter size: 20 Guage Catheter at skin depth: 13 cm Test dose: negative and 1.5% lidocaine with Epi 1:200 K  Assessment Sensory level: T10 Events: blood not aspirated, injection not painful, no injection resistance, negative IV test and no paresthesia  Additional Notes   Patient tolerated the insertion well without complications.Reason for block:procedure for pain

## 2015-10-29 LAB — CBC
HEMATOCRIT: 25 % — AB (ref 35.0–47.0)
HEMOGLOBIN: 8.5 g/dL — AB (ref 12.0–16.0)
MCH: 27.9 pg (ref 26.0–34.0)
MCHC: 34 g/dL (ref 32.0–36.0)
MCV: 82.1 fL (ref 80.0–100.0)
Platelets: 189 10*3/uL (ref 150–440)
RBC: 3.04 MIL/uL — ABNORMAL LOW (ref 3.80–5.20)
RDW: 14.7 % — ABNORMAL HIGH (ref 11.5–14.5)
WBC: 7.5 10*3/uL (ref 3.6–11.0)

## 2015-10-29 LAB — RPR: RPR Ser Ql: NONREACTIVE

## 2015-10-29 MED ORDER — IBUPROFEN 800 MG PO TABS: 800.0000 mg | ORAL_TABLET | Freq: Four times a day (QID) | ORAL | Status: DC

## 2015-10-29 MED ORDER — MEDROXYPROGESTERONE ACETATE 150 MG/ML IM SUSP
150.0000 mg | Freq: Once | INTRAMUSCULAR | Status: AC
  Administered 2015-10-29: 150 mg via INTRAMUSCULAR
  Filled 2015-10-29: qty 1

## 2015-10-29 MED ORDER — DOCUSATE SODIUM 100 MG PO CAPS: 100.0000 mg | ORAL_CAPSULE | Freq: Two times a day (BID) | ORAL | Status: DC | PRN

## 2015-10-29 NOTE — Anesthesia Post-op Follow-up Note (Signed)
  Anesthesia Pain Follow-up Note  Patient: Samantha Heath  Day #: 1  Date of Follow-up: 10/29/2015 Time: 7:09 AM  Last Vitals:  Filed Vitals:   10/28/15 2309 10/29/15 0323  BP: 93/55 100/58  Pulse: 80 78  Temp: 36.9 C 36.8 C  Resp: 18 12    Level of Consciousness: alert  Pain: mild   Side Effects:None  Catheter Site Exam: site not evaluted  Plan: D/C from anesthesia care  Clydene PughBeane, Addie Alonge D

## 2015-10-29 NOTE — Discharge Instructions (Signed)

## 2015-10-29 NOTE — Progress Notes (Signed)
Pt. Discharged home in wheelchair at 1612.

## 2015-10-29 NOTE — Progress Notes (Signed)
Post Partum Day # 1, s/p SVD  Subjective: no complaints, up ad lib, voiding and tolerating PO.  Desires to go home today if possible.   Objective: Temp:  [97 F (36.1 C)-98.8 F (37.1 C)] 98.2 F (36.8 C) (03/15 0323) Pulse Rate:  [78-115] 78 (03/15 0323) Resp:  [12-20] 12 (03/15 0323) BP: (85-117)/(45-75) 100/58 mmHg (03/15 0323) SpO2:  [93 %-100 %] 100 % (03/15 0323)  Physical Exam:  General: alert and no distress  Lungs: clear to auscultation bilaterally Breasts: normal appearance, no masses or tenderness Heart: regular rate and rhythm, S1, S2 normal, no murmur, click, rub or gallop Pelvis: Lochia: appropriate, Uterine Fundus: firm Extremities: DVT Evaluation: No evidence of DVT seen on physical exam.   Recent Labs  10/28/15 0418 10/29/15 0531  HGB 9.6* 8.5*  HCT 28.6* 25.0*    Assessment/Plan: Discharge home and Contraception Depo Provera (to be given prior to discharge) Anemia of pregnancy - to continue iron tablets postpartum BID.     LOS: 1 day   Hildred LaserAnika Clydean Posas [facility]

## 2015-10-29 NOTE — Discharge Summary (Signed)
Reviewed discharge instructions with patient mother, verbalized understanding. Copy of discharge instructions given to patient with prescriptions. TDAP and depo shot given. Stable upon discharge. Waiting for FOB to complete birth certificate for discharge.

## 2015-10-29 NOTE — Anesthesia Postprocedure Evaluation (Signed)
Anesthesia Post Note  Patient: Samantha Heath  Procedure(s) Performed: * No procedures listed *  Patient location during evaluation: Mother Baby Anesthesia Type: Epidural Level of consciousness: awake and alert and oriented Pain management: satisfactory to patient Vital Signs Assessment: post-procedure vital signs reviewed and stable Respiratory status: respiratory function stable Cardiovascular status: stable Postop Assessment: no headache, no backache, no signs of nausea or vomiting, patient able to bend at knees and adequate PO intake Anesthetic complications: no    Last Vitals:  Filed Vitals:   10/28/15 2309 10/29/15 0323  BP: 93/55 100/58  Pulse: 80 78  Temp: 36.9 C 36.8 C  Resp: 18 12    Last Pain:  Filed Vitals:   10/29/15 0323  PainSc: 2                  Clydene PughBeane, Laci Frenkel D

## 2015-10-29 NOTE — Discharge Summary (Signed)
Obstetric Discharge Summary Reason for Admission: rupture of membranes Prenatal Procedures: ultrasound Intrapartum Procedures: spontaneous vaginal delivery Postpartum Procedures: none and received Depo Provera postpartum.  Complications-Operative and Postpartum: none   HEMOGLOBIN  Date Value Ref Range Status  10/29/2015 8.5* 12.0 - 16.0 g/dL Final   HGB  Date Value Ref Range Status  04/17/2013 12.2 12.0-16.0 g/dL Final   HCT  Date Value Ref Range Status  10/29/2015 25.0* 35.0 - 47.0 % Final  04/17/2013 36.5 35.0-47.0 % Final   HEMATOCRIT  Date Value Ref Range Status  10/08/2015 28.6* 34.0 - 46.6 % Final    Physical Exam:  General: alert and no distress Lochia: appropriate Uterine Fundus: firm Incision: none DVT Evaluation: Negative Homan's sign. No cords or calf tenderness. No significant calf/ankle edema.  Discharge Diagnoses: Term Pregnancy-delivered  Discharge Information: Date: 10/29/2015 Activity: pelvic rest Diet: routine Medications: PNV, Ibuprofen, Colace and Iron Condition: stable Instructions: refer to practice specific booklet Discharge to: home   Follow-up Information    Follow up with Samantha LaserAnika Shalynn Jorstad, MD In 6 weeks.   Specialties:  Obstetrics and Gynecology, Radiology   Why:  Postpartum visit   Contact information:   [address] [place] [id number] [phone]       Newborn Data: Live born female  Birth Weight: 7 lb 4.8 oz (3310 g) APGAR: 8, 9  Home with mother.  Samantha Heath 10/29/2015, 8:01 AM

## 2015-10-30 ENCOUNTER — Encounter: Payer: Medicaid Other | Admitting: Obstetrics and Gynecology
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
## 2016-02-16 ENCOUNTER — Encounter: Payer: Self-pay | Admitting: Emergency Medicine

## 2016-02-16 ENCOUNTER — Emergency Department
Admission: EM | Admit: 2016-02-16 | Discharge: 2016-02-16 | Disposition: A | Discharge status: Home / Self Care | Payer: Medicaid Other | Attending: Emergency Medicine | Admitting: Emergency Medicine

## 2016-02-16 DIAGNOSIS — H109 Unspecified conjunctivitis: Secondary | ICD-10-CM

## 2016-02-16 MED ORDER — BACITRACIN-POLYMYXIN B 500-10000 UNIT/GM OP OINT: TOPICAL_OINTMENT | Freq: Two times a day (BID) | OPHTHALMIC | Status: AC

## 2016-02-16 NOTE — ED Notes (Signed)
Pt woke up with right eye swelling and drainage, right eye is swollen

## 2016-02-16 NOTE — ED Provider Notes (Signed)
[facility] Emergency Department Provider Note  ____________________________________________  Time seen: On arrival  I have reviewed the triage vital signs and the nursing notes.   HISTORY  Chief Complaint Eye Pain and Eye Drainage    HPI Samantha Heath is a 30 y.o. female who presents with complaints of right eye redness and drainage when she woke up this morning. No decreased vision, she reports mild itching.    Past Medical History  Diagnosis Date  . Nausea and vomiting during pregnancy   . History of gonorrhea 2011  . Bacterial vaginal infection   . Flu vaccine need     at 16 weeks  . Need for Tdap vaccination     at 28 weeks  . Marijuana use     last used 02/2015  . H/O gonorrhea     Patient Active Problem List   Diagnosis Date Noted  . Labor and delivery, indication for care 10/28/2015  . UTI in pregnancy 10/15/2015  . H/O oligohydramnios in prior pregnancy, currently pregnant 10/15/2015  . Pregnancy 10/13/2015  . Irregular uterine contractions 10/13/2015  . UTI (urinary tract infection) in pregnancy in third trimester 10/13/2015  . Anemia of pregnancy in third trimester 10/13/2015  . Poor weight gain of pregnancy 10/08/2015  . Flu vaccine need 09/10/2015  . Supervision of normal pregnancy in third trimester 07/06/2015  . Marijuana abuse 05/23/2015    Past Surgical History  Procedure Laterality Date  . No past surgeries      Current Outpatient Rx  Name  Route  Sig  Dispense  Refill  . bacitracin-polymyxin b (POLYSPORIN) ophthalmic ointment   Right Eye   Place into the right eye 2 (two) times daily. Place a 1/2 inch ribbon of ointment into the lower eyelid.   3.5 g   0   . docusate sodium (COLACE) 100 MG capsule   Oral   Take 1 capsule (100 mg total) by mouth 2 (two) times daily as needed.   30 capsule   2   . ferrous sulfate 325 (65 FE) MG tablet   Oral   Take 1 tablet (325 mg total) by mouth 2 (two) times daily with  a meal.   60 tablet   3   . ibuprofen (ADVIL,MOTRIN) 800 MG tablet   Oral   Take 1 tablet (800 mg total) by mouth every 6 (six) hours.   60 tablet   1   . Prenat-FeFum-FePo-FA-Omega 3 (CONCEPT DHA) 53.5-38-1 MG CAPS   Oral   Take 53.5 mg by mouth daily.   30 capsule   11     Allergies Review of patient's allergies indicates no known allergies.  Family History  Problem Relation Age of Onset  . Cancer Neg Hx   . Diabetes Neg Hx   . Heart disease Neg Hx     Social History Social History  Substance Use Topics  . Smoking status: Never Smoker   . Smokeless tobacco: None  . Alcohol Use: Yes     Comment: occas     Review of Systems  Constitutional: Negative for fever. Eyes: Negative for visual changes.Redness to right eye ENT: Negative for sore throat   Genitourinary: Negative for vaginal discharge  Skin: Negative for rash. Neurological: Negative for headaches or focal weakness   ____________________________________________   PHYSICAL EXAM:  VITAL SIGNS: ED Triage Vitals  Enc Vitals Group     BP 02/16/16 1658 109/71 mmHg     Pulse Rate 02/16/16 1658 84  Resp 02/16/16 1658 18     Temp 02/16/16 1658 98.8 F (37.1 C)     Temp Source 02/16/16 1658 Oral     SpO2 02/16/16 1658 99 %     Weight 02/16/16 1658 158 lb (71.668 kg)     Height 02/16/16 1658 5\' 10"  (1.778 m)     Head Cir --      Peak Flow --      Pain Score 02/16/16 1701 5     Pain Loc --      Pain Edu? --      Excl. in GC? --     Constitutional: Alert and oriented. Well appearing and in no distress. Eyes: Mild erythema of conjunctiva on the right, left is normal ENT   Head: Normocephalic and atraumatic.   Mouth/Throat: Mucous membranes are moist.  Respiratory: Normal respiratory effort without tachypnea nor retractions.    Neurologic:  Normal speech and language. No gross focal neurologic deficits are appreciated. Skin:  Skin is warm, dry and intact. No rash  noted.   ____________________________________________    LABS (pertinent positives/negatives)  Labs Reviewed - No data to display  ____________________________________________     ____________________________________________    RADIOLOGY I have personally reviewed any xrays that were ordered on this patient: None  ____________________________________________   PROCEDURES  Procedure(s) performed: none   ____________________________________________   INITIAL IMPRESSION / ASSESSMENT AND PLAN / ED COURSE  Pertinent labs & imaging results that were available during my care of the patient were reviewed by me and considered in my medical decision making (see chart for details).  Exam is consistent with conjunctivitis, we will provide antibiotic eyedrops  ____________________________________________   FINAL CLINICAL IMPRESSION(S) / ED DIAGNOSES  Final diagnoses:  Conjunctivitis of right eye     [name], MD 02/16/16 1717

## 2016-02-16 NOTE — Discharge Instructions (Signed)

## 2016-02-16 NOTE — ED Notes (Signed)
Patient presents to the ED with right eye redness, pain, and discharge since yesterday afternoon.  Patient is in no obvious distress.  Patient denies blurry vision in right eye.

## 2016-03-16 ENCOUNTER — Emergency Department: Payer: Self-pay

## 2016-03-16 ENCOUNTER — Encounter: Payer: Self-pay | Admitting: Emergency Medicine

## 2016-03-16 ENCOUNTER — Emergency Department
Admission: EM | Admit: 2016-03-16 | Discharge: 2016-03-16 | Disposition: A | Discharge status: Home / Self Care | Payer: Self-pay | Attending: Emergency Medicine | Admitting: Emergency Medicine

## 2016-03-16 DIAGNOSIS — S92414A Nondisplaced fracture of proximal phalanx of right great toe, initial encounter for closed fracture: Secondary | ICD-10-CM | POA: Insufficient documentation

## 2016-03-16 DIAGNOSIS — Y9389 Activity, other specified: Secondary | ICD-10-CM | POA: Insufficient documentation

## 2016-03-16 DIAGNOSIS — Y929 Unspecified place or not applicable: Secondary | ICD-10-CM | POA: Insufficient documentation

## 2016-03-16 DIAGNOSIS — F1290 Cannabis use, unspecified, uncomplicated: Secondary | ICD-10-CM | POA: Insufficient documentation

## 2016-03-16 DIAGNOSIS — W228XXA Striking against or struck by other objects, initial encounter: Secondary | ICD-10-CM | POA: Insufficient documentation

## 2016-03-16 DIAGNOSIS — Y999 Unspecified external cause status: Secondary | ICD-10-CM | POA: Insufficient documentation

## 2016-03-16 DIAGNOSIS — Z79899 Other long term (current) drug therapy: Secondary | ICD-10-CM | POA: Insufficient documentation

## 2016-03-16 MED ORDER — IBUPROFEN 800 MG PO TABS: 800.0000 mg | ORAL_TABLET | Freq: Three times a day (TID) | ORAL | 0 refills | Status: DC | PRN

## 2016-03-16 MED ORDER — HYDROCODONE-ACETAMINOPHEN 5-325 MG PO TABS: 1.0000 | ORAL_TABLET | Freq: Four times a day (QID) | ORAL | 0 refills | Status: DC | PRN

## 2016-03-16 NOTE — Discharge Instructions (Signed)
Take the prescription meds as directed. Apply ice to reduce pain and swelling. Follow-up with Dr. Orland Jarred for continued symptoms.

## 2016-03-16 NOTE — ED Notes (Signed)
See triage note..the patient c/o swelling ay R big toe. Circulation/sensatiopn intact.  Pt sts she injured toe last Friday, pain not helped w/ ibuprofen.  NAD.  Resp even and unlabored.  Skin color WNL.

## 2016-03-16 NOTE — ED Triage Notes (Signed)
Patient ambulatory to triage with steady gait, without difficulty or distress noted; pt reports "kicked something" Sunday with right great toe--c/o pain/swelling since

## 2016-03-16 NOTE — ED Provider Notes (Signed)
[facility] Emergency Department Provider Note ____________________________________________  Time seen: 2038  I have reviewed the triage vital signs and the nursing notes.  HISTORY  Chief Complaint  Toe Injury  HPI Samantha Heath is a 30 y.o. female presents to the ED for evaluation and management of acute pain, bruising, and swelling to the right great toe. She describes that 3 days prior, she accidentally kicked a wooden box that was on the floor, trying to kick a ball. She noted immediate pain to the base of the right great toe. Since that time she's had increased swelling, bruising, and disability to the great toe. She's been taking 400 mg ibuprofen intermittently for pain relief. She denies any other injury at this time.  Past Medical History:  Diagnosis Date  . Bacterial vaginal infection   . Flu vaccine need    at 16 weeks  . H/O gonorrhea   . History of gonorrhea 2011  . Marijuana use    last used 02/2015  . Nausea and vomiting during pregnancy   . Need for Tdap vaccination    at 28 weeks    Patient Active Problem List   Diagnosis Date Noted  . Labor and delivery, indication for care 10/28/2015  . UTI in pregnancy 10/15/2015  . H/O oligohydramnios in prior pregnancy, currently pregnant 10/15/2015  . Pregnancy 10/13/2015  . Irregular uterine contractions 10/13/2015  . UTI (urinary tract infection) in pregnancy in third trimester 10/13/2015  . Anemia of pregnancy in third trimester 10/13/2015  . Poor weight gain of pregnancy 10/08/2015  . Flu vaccine need 09/10/2015  . Supervision of normal pregnancy in third trimester 07/06/2015  . Marijuana abuse 05/23/2015    Past Surgical History:  Procedure Laterality Date  . NO PAST SURGERIES      Current Outpatient Rx  . Order #: [id number] Class: Print  . Order #: [id number] Class: Normal  . Order #: [id number] Class: Print  . Order #: [id number] Class: Print  . Order #: [id number] Class: Normal    Allergies Review of patient's allergies indicates no known allergies.  Family History  Problem Relation Age of Onset  . Cancer Neg Hx   . Diabetes Neg Hx   . Heart disease Neg Hx     Social History Social History  Substance Use Topics  . Smoking status: Never Smoker  . Smokeless tobacco: Never Used  . Alcohol use Yes     Comment: occas    Review of Systems  Constitutional: Negative for fever. Musculoskeletal: Negative for back pain. Right toe pain as above Skin: Negative for rash. Neurological: Negative for headaches, focal weakness or numbness. ____________________________________________  PHYSICAL EXAM:  VITAL SIGNS: ED Triage Vitals  Enc Vitals Group     BP 03/16/16 1954 (!) 134/95     Pulse Rate 03/16/16 1954 (!) 103     Resp 03/16/16 1954 18     Temp 03/16/16 1954 97.7 F (36.5 C)     Temp Source 03/16/16 1954 Oral     SpO2 03/16/16 1954 99 %     Weight 03/16/16 1954 155 lb (70.3 kg)     Height 03/16/16 1954  (1.778 m)     Head Circumference --      Peak Flow --      Pain Score 03/16/16 1955 7     Pain Loc --      Pain Edu? --      Excl. in GC? --    Constitutional: Alert and oriented. Well appearing  and in no distress. Head: Normocephalic and atraumatic. Cardiovascular: Normal distal pulses and cap refill  Musculoskeletal: Right foot without obvious deformity or dislocation. There is some moderate swelling and ecchymosis noted to the right great toe. Patient with tenderness to palpation at the medial aspect of the proximal phalanx. Nontender with normal range of motion in all extremities.  Neurologic:  Antalgic gait without ataxia. Normal speech and language. No gross focal neurologic deficits are appreciated. Skin:  Skin is warm, dry and intact. No rash noted. ____________________________________________   RADIOLOGY  Right Foot IMPRESSION: Suspect nondisplaced great toe proximal phalanx fracture, seen only on a single view.  I, Iyania Denne,  Charlesetta Ivory, personally viewed and evaluated these images (plain radiographs) as part of my medical decision making, as well as reviewing the written report by the radiologist. ____________________________________________  PROCEDURES  Post-op shoe ____________________________________________  INITIAL IMPRESSION / ASSESSMENT AND PLAN / ED COURSE  Patient with initial fracture care for a nondisplaced right great toe proximal phalanx fracture. She is fitted with a postop shoe and provided with prescriptions for ibuprofen and hydrocodone. She will follow-up with Dr. Orland Jarred for ongoing symptom management.  Clinical Course   ____________________________________________  FINAL CLINICAL IMPRESSION(S) / ED DIAGNOSES  Final diagnoses:  Closed nondisplaced fracture of proximal phalanx of right great toe, initial encounter     Lissa Hoard, PA-C 03/16/16 4287    Sharyn Creamer, MD 03/17/16 [phone]

## 2016-07-25 IMAGING — US US OB COMP LESS 14 WK
1 series · 14 of 28 positions shown · non-contrast
Comparison: None.

CLINICAL DATA: Patient with pelvic pain.  Positive pregnancy test.

EXAM:
OBSTETRIC <14 WK US AND TRANSVAGINAL OB US
TECHNIQUE: Both transabdominal and transvaginal ultrasound examinations were
performed for complete evaluation of the gestation as well as the
maternal uterus, adnexal regions, and pelvic cul-de-sac.
Transvaginal technique was performed to assess early pregnancy.

[Series 1: us ob comp less 14 wk · 0.22mm/px · 90 acquisitions, 14 frames shown]
[im 4/90]
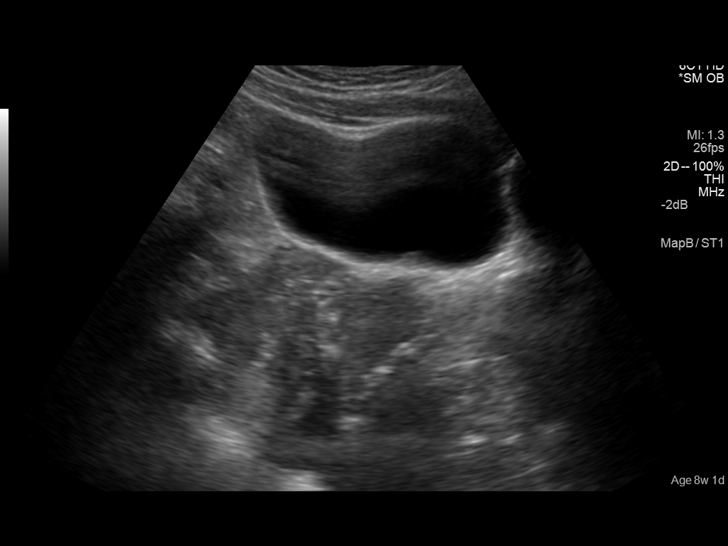
[im 10/90]
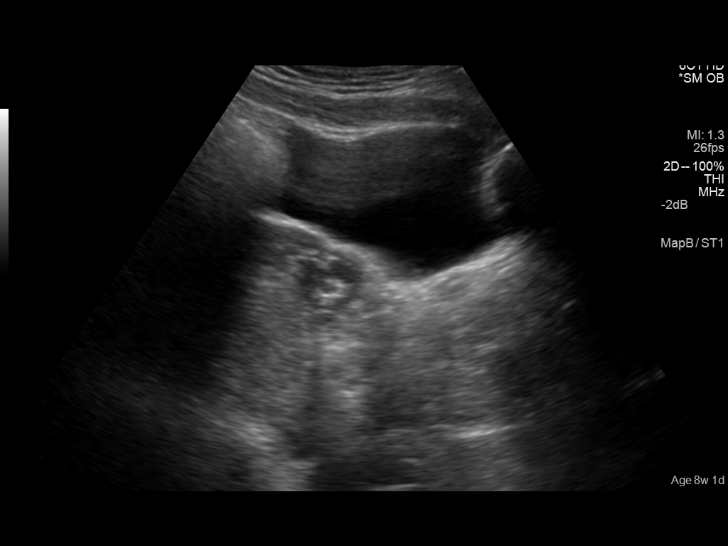
[im 17/90]
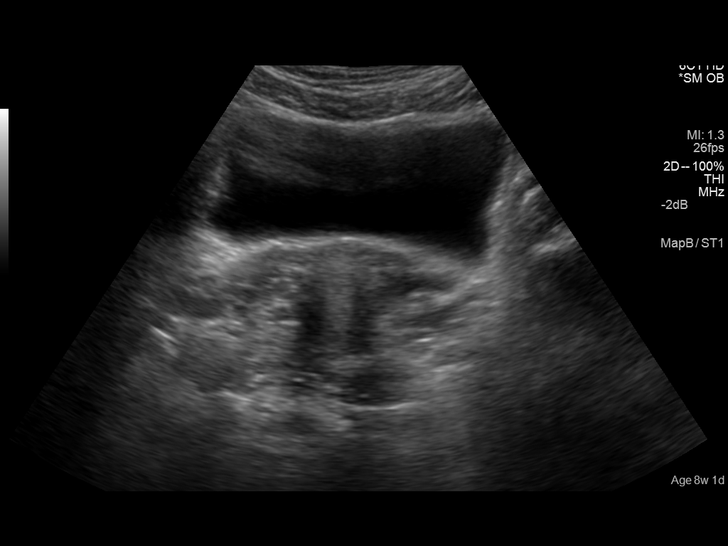
[im 24/90]
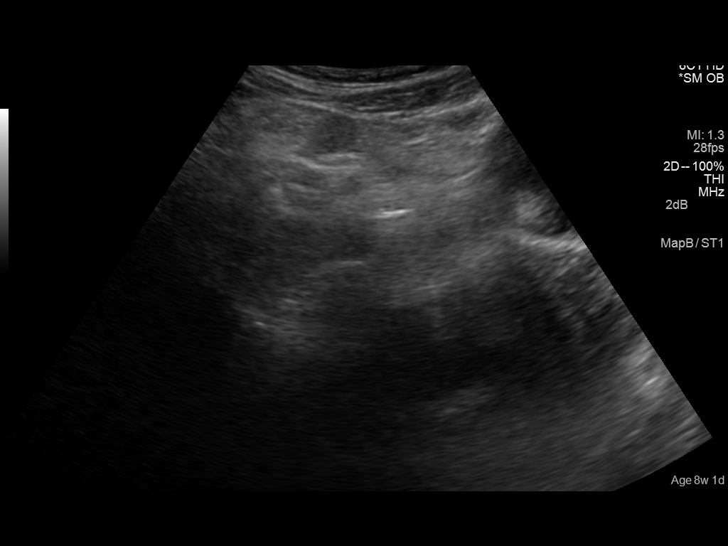
[im 30/90]
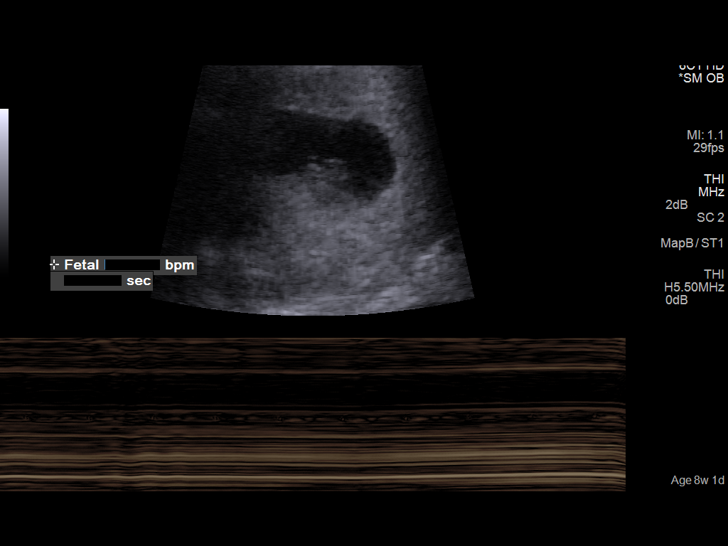
[im 37/90]
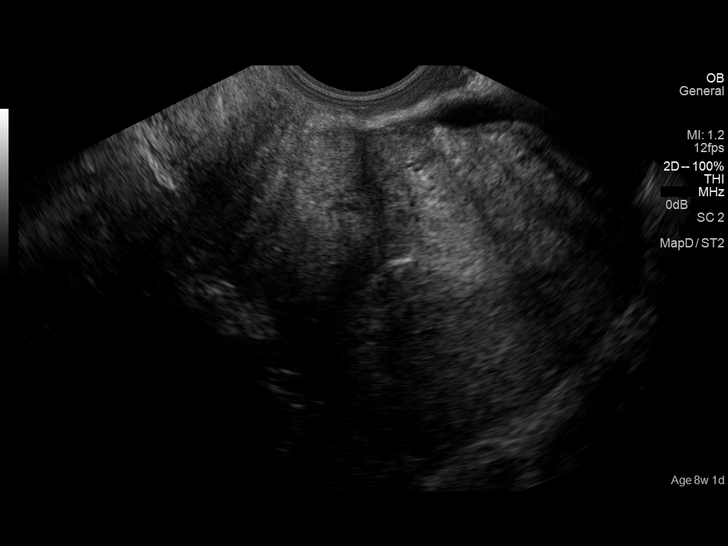
[im 43/90]
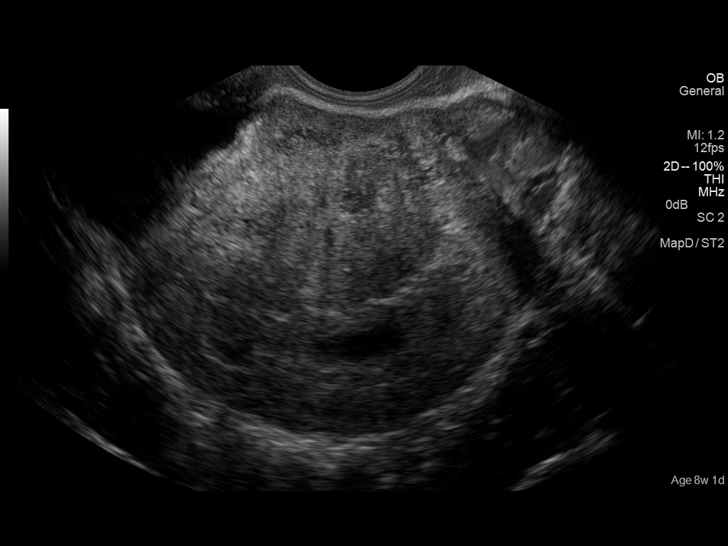
[im 50/90]
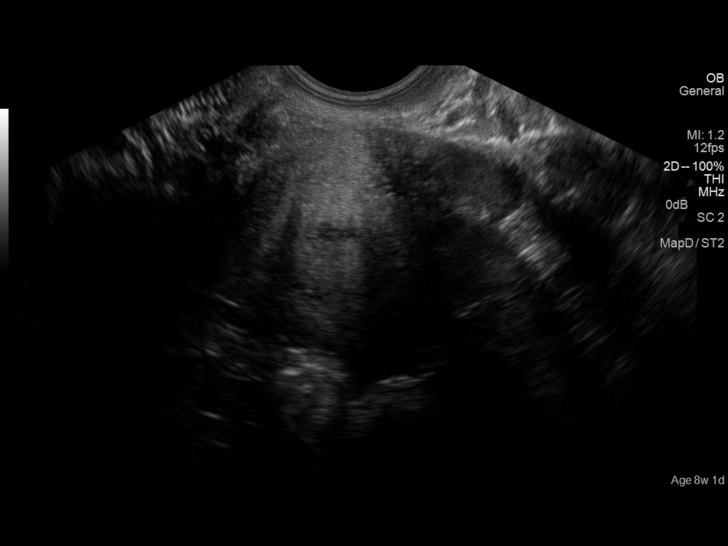
[im 57/90]
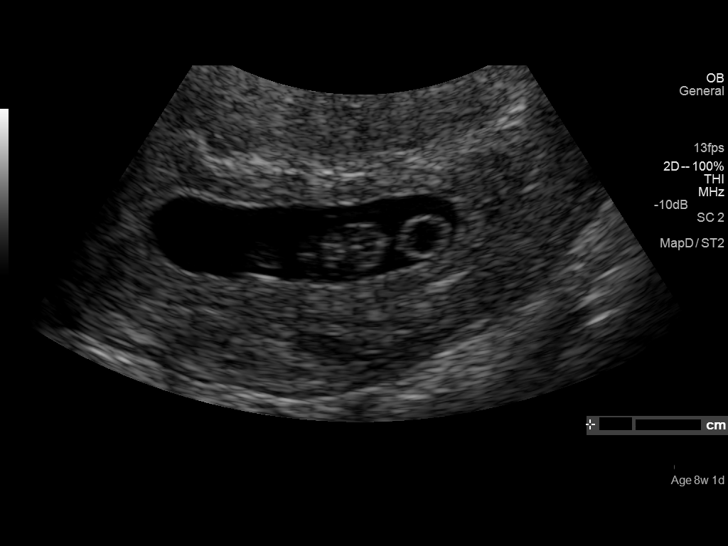
[im 63/90]
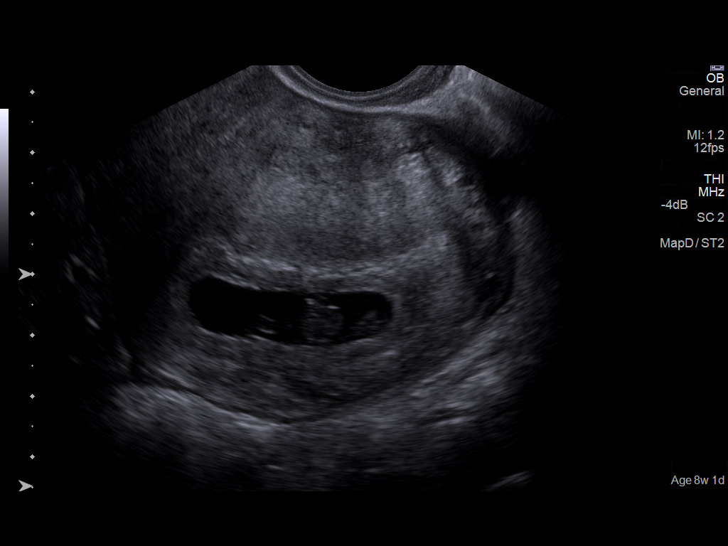
[im 70/90]
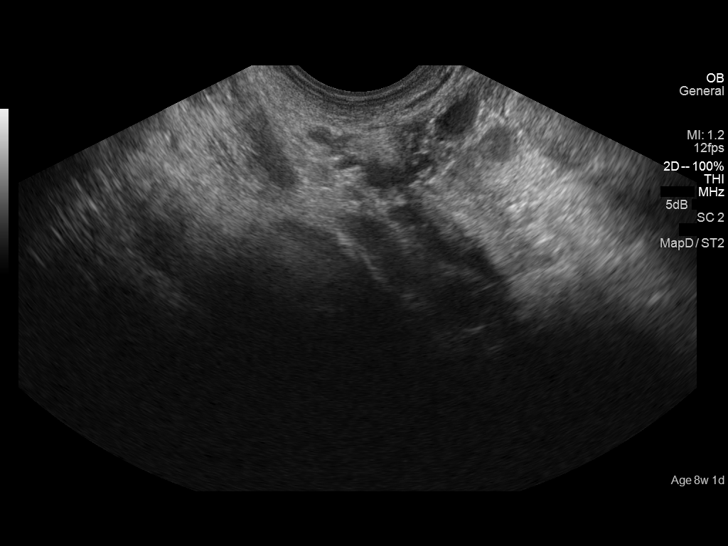
[im 76/90]
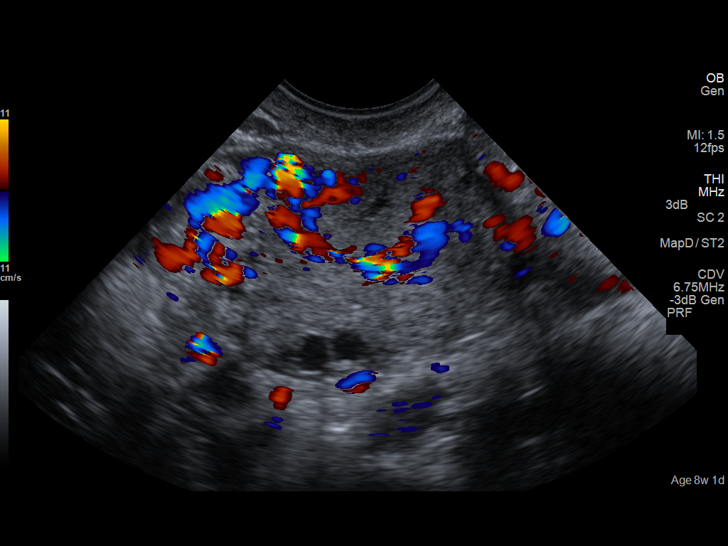
[im 83/90]
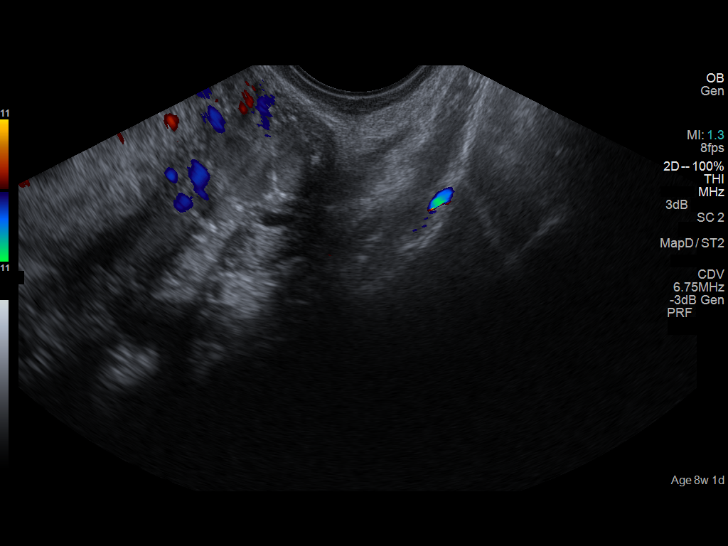
[im 90/90]
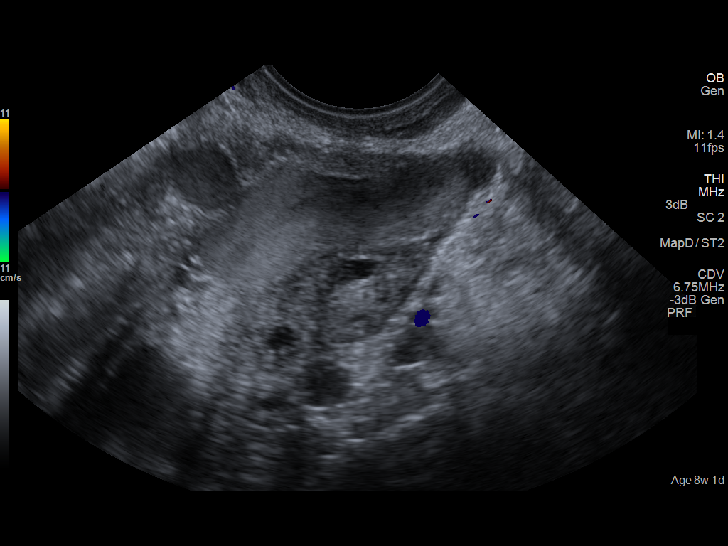

[14 of 28 positions shown; findings below may reference images not displayed]

FINDINGS: Intrauterine gestational sac: Visualized/normal in shape.

Yolk sac:  Present

Embryo:  Present

Cardiac Activity: Present

Heart Rate: 163  bpm

CRL:  15.8  mm   8 w   0 d                  US EDC: 11/01/2015

Maternal uterus/adnexae: Probable corpus luteum right ovary. Left
ovary is unremarkable. No subchorionic hemorrhage. Small amount of
free fluid within the pelvis.
IMPRESSION: Single live intrauterine gestation 8 weeks 1 day by LMP.

## 2017-02-06 ENCOUNTER — Emergency Department
Admission: EM | Admit: 2017-02-06 | Discharge: 2017-02-06 | Disposition: A | Discharge status: Home / Self Care | Payer: Medicaid Other | Attending: Emergency Medicine | Admitting: Emergency Medicine

## 2017-02-06 ENCOUNTER — Encounter: Payer: Self-pay | Admitting: *Deleted

## 2017-02-06 DIAGNOSIS — N760 Acute vaginitis: Secondary | ICD-10-CM | POA: Insufficient documentation

## 2017-02-06 DIAGNOSIS — N939 Abnormal uterine and vaginal bleeding, unspecified: Secondary | ICD-10-CM

## 2017-02-06 DIAGNOSIS — B9689 Other specified bacterial agents as the cause of diseases classified elsewhere: Secondary | ICD-10-CM

## 2017-02-06 DIAGNOSIS — N938 Other specified abnormal uterine and vaginal bleeding: Secondary | ICD-10-CM | POA: Insufficient documentation

## 2017-02-06 LAB — COMPREHENSIVE METABOLIC PANEL
ALBUMIN: 4.1 g/dL (ref 3.5–5.0)
ALT: 12 U/L — AB (ref 14–54)
AST: 16 U/L (ref 15–41)
Alkaline Phosphatase: 66 U/L (ref 38–126)
Anion gap: 7 (ref 5–15)
BUN: 10 mg/dL (ref 6–20)
CHLORIDE: 106 mmol/L (ref 101–111)
CO2: 27 mmol/L (ref 22–32)
CREATININE: 0.72 mg/dL (ref 0.44–1.00)
Calcium: 9.2 mg/dL (ref 8.9–10.3)
GFR calc Af Amer: >60 mL/min (ref >60)
GFR calc non Af Amer: >60 mL/min (ref >60)
GLUCOSE: 120 mg/dL — AB (ref 65–99)
POTASSIUM: 4.2 mmol/L (ref 3.5–5.1)
Sodium: 140 mmol/L (ref 135–145)
Total Bilirubin: 0.4 mg/dL (ref 0.3–1.2)
Total Protein: 7.5 g/dL (ref 6.5–8.1)

## 2017-02-06 LAB — LIPASE, BLOOD: LIPASE: 30 U/L (ref 11–51)

## 2017-02-06 LAB — URINALYSIS, COMPLETE (UACMP) WITH MICROSCOPIC
BACTERIA UA: NONE SEEN
Bilirubin Urine: NEGATIVE
Glucose, UA: NEGATIVE mg/dL
Ketones, ur: NEGATIVE mg/dL
LEUKOCYTES UA: NEGATIVE
Nitrite: NEGATIVE
Protein, ur: 30 mg/dL — AB
Specific Gravity, Urine: 1.013 (ref 1.005–1.030)
pH: 5 (ref 5.0–8.0)

## 2017-02-06 LAB — CBC
HEMATOCRIT: 34.7 % — AB (ref 35.0–47.0)
Hemoglobin: 11.6 g/dL — ABNORMAL LOW (ref 12.0–16.0)
MCH: 28.2 pg (ref 26.0–34.0)
MCHC: 33.5 g/dL (ref 32.0–36.0)
MCV: 84.3 fL (ref 80.0–100.0)
PLATELETS: 291 10*3/uL (ref 150–440)
RBC: 4.11 MIL/uL (ref 3.80–5.20)
RDW: 14 % (ref 11.5–14.5)
WBC: 5.6 10*3/uL (ref 3.6–11.0)

## 2017-02-06 LAB — POCT PREGNANCY, URINE: PREG TEST UR: NEGATIVE

## 2017-02-06 LAB — WET PREP, GENITAL
Sperm: NONE SEEN
Trich, Wet Prep: NONE SEEN
WBC, Wet Prep HPF POC: NONE SEEN
YEAST WET PREP: NONE SEEN

## 2017-02-06 LAB — CHLAMYDIA/NGC RT PCR (ARMC ONLY)
CHLAMYDIA TR: NOT DETECTED
N gonorrhoeae: NOT DETECTED

## 2017-02-06 LAB — HCG, QUANTITATIVE, PREGNANCY: HCG, BETA CHAIN, QUANT, S: 1 m[IU]/mL (ref <5)

## 2017-02-06 MED ORDER — NORGESTIMATE-ETH ESTRADIOL 0.25-35 MG-MCG PO TABS: 1.0000 | ORAL_TABLET | Freq: Every day | ORAL | 11 refills | Status: DC

## 2017-02-06 MED ORDER — METRONIDAZOLE 500 MG PO TABS: 500.0000 mg | ORAL_TABLET | Freq: Two times a day (BID) | ORAL | 0 refills | Status: AC

## 2017-02-06 NOTE — ED Triage Notes (Signed)
Pt reports having had a normal period the 1-6th of this months and then began to spot again on the 14th and had been bleeding since then. Pt reports blood is darker than normal with clots presents. Pt reports not thinking she was [preganant but denies birth control use. Pt using a reported 9 pads a day with nausea and lower abd pain beginning the past couple days. Pt reports increased fatigue as well.

## 2017-02-06 NOTE — ED Provider Notes (Signed)
[facility] Emergency Department Provider Note  Time seen: 5:34 PM  I have reviewed the triage vital signs and the nursing notes.   HISTORY  Chief Complaint Vaginal Bleeding    HPI Samantha Heath is a 31 y.o. female with G3 P3, no significant past medical history who presents to the emergency department for vaginal bleeding. According to the patient for the past 14 days she has been experiencing fairly heavy vaginal bleeding with occasional large clots. Denies any abdominal pain, lightheadedness or dizziness. Patient states a history of heavy bleeding in the past states she was seen approximately 1 year ago by GYN for the same. She states it is abnormal for that. Last this long however. Does not believe she is pregnant but is not currently on any birth control. Denies any complaints at this time.  Past Medical History:  Diagnosis Date  . Bacterial vaginal infection   . Flu vaccine need    at 16 weeks  . H/O gonorrhea   . History of gonorrhea 2011  . Marijuana use    last used 02/2015  . Nausea and vomiting during pregnancy   . Need for Tdap vaccination    at 28 weeks    Patient Active Problem List   Diagnosis Date Noted  . Labor and delivery, indication for care 10/28/2015  . UTI in pregnancy 10/15/2015  . H/O oligohydramnios in prior pregnancy, currently pregnant 10/15/2015  . Pregnancy 10/13/2015  . Irregular uterine contractions 10/13/2015  . UTI (urinary tract infection) in pregnancy in third trimester 10/13/2015  . Anemia of pregnancy in third trimester 10/13/2015  . Poor weight gain of pregnancy 10/08/2015  . Flu vaccine need 09/10/2015  . Supervision of normal pregnancy in third trimester 07/06/2015  . Marijuana abuse 05/23/2015    Past Surgical History:  Procedure Laterality Date  . NO PAST SURGERIES      Prior to Admission medications   Medication Sig Start Date End Date Taking? Authorizing Provider  docusate sodium (COLACE) 100 MG  capsule Take 1 capsule (100 mg total) by mouth 2 (two) times daily as needed. 10/29/15   Hildred Laser, MD  ferrous sulfate 325 (65 FE) MG tablet Take 1 tablet (325 mg total) by mouth 2 (two) times daily with a meal. 10/13/15   Hildred Laser, MD  HYDROcodone-acetaminophen (NORCO) 5-325 MG tablet Take 1 tablet by mouth every 6 (six) hours as needed. 03/16/16   Menshew, Charlesetta Ivory, PA-C  ibuprofen (ADVIL,MOTRIN) 800 MG tablet Take 1 tablet (800 mg total) by mouth every 8 (eight) hours as needed. 03/16/16   Menshew, Charlesetta Ivory, PA-C  Prenat-FeFum-FePo-FA-Omega 3 (CONCEPT DHA) 53.5-38-1 MG CAPS Take 53.5 mg by mouth daily. 08/27/15   Defrancesco, Prentice Docker, MD    No Known Allergies  Family History  Problem Relation Age of Onset  . Cancer Neg Hx   . Diabetes Neg Hx   . Heart disease Neg Hx     Social History Social History  Substance Use Topics  . Smoking status: Never Smoker  . Smokeless tobacco: Never Used  . Alcohol use Yes     Comment: occas     Review of Systems Constitutional: Negative for fever Cardiovascular: Negative for chest pain. Respiratory: Negative for shortness of breath. Gastrointestinal: Negative for abdominal pain, vomiting and diarrhea. Genitourinary: Positive for vaginal bleeding with clots. Musculoskeletal: Negative for back pain. Neurological: Negative for headache All other ROS negative  ____________________________________________   PHYSICAL EXAM:  VITAL SIGNS: ED  Triage Vitals  Enc Vitals Group     BP 02/06/17 1402 (!) 111/59     Pulse Rate 02/06/17 1402 96     Resp 02/06/17 1402 18     Temp 02/06/17 1408 98.5 F (36.9 C)     Temp Source 02/06/17 1408 Oral     SpO2 02/06/17 1402 99 %     Weight 02/06/17 1403 161 lb 6 oz (73.2 kg)     Height 02/06/17 1403 5\' 10"  (1.778 m)     Head Circumference --      Peak Flow --      Pain Score 02/06/17 1402 6     Pain Loc --      Pain Edu? --      Excl. in GC? --     Constitutional: Alert and  oriented. Well appearing and in no distress. Eyes: Normal exam ENT   Head: Normocephalic and atraumatic.   Mouth/Throat: Mucous membranes are moist. Cardiovascular: Normal rate, regular rhythm. No murmur Respiratory: Normal respiratory effort without tachypnea nor retractions. Breath sounds are clear  Gastrointestinal: Soft and nontender. No distention.   Musculoskeletal: Nontender with normal range of motion in all extremities. Neurologic:  Normal speech and language. No gross focal neurologic deficits  Skin:  Skin is warm, dry and intact.  Psychiatric: Mood and affect are normal.  ____________________________________________   INITIAL IMPRESSION / ASSESSMENT AND PLAN / ED COURSE  Pertinent labs & imaging results that were available during my care of the patient were reviewed by me and considered in my medical decision making (see chart for details).  Patient presents to the emergency department for vaginal bleeding over the past 14 days with clots. Patient's labs are largely within normal limits. Hemoglobin of 11.6 is actually increased over baseline. Beta hCG is negative. We will perform a pelvic exam. As long she pelvic is normal we will start the patient on estrogen containing OCPs and have her follow-up with GYN. Patient agreeable to plan.  Nontender pelvic exam. Patient has mild bleeding currently. No adnexal or cervical motion tenderness. No significant discharge identified.  Wet prep positive for clue cells. We will discharge with PCP follow-up. ____________________________________________   FINAL CLINICAL IMPRESSION(S) / ED DIAGNOSES  Dysfunctional uterine bleeding    Minna AntisPaduchowski, Krystalle Pilkington, MD 02/06/17 [phone]

## 2019-04-05 ENCOUNTER — Other Ambulatory Visit: Payer: Self-pay

## 2019-04-05 ENCOUNTER — Ambulatory Visit (LOCAL_COMMUNITY_HEALTH_CENTER): Payer: Self-pay

## 2019-04-05 VITALS — BP 107/67 | Ht 70.0 in | Wt 165.0 lb

## 2019-04-05 DIAGNOSIS — Z3201 Encounter for pregnancy test, result positive: Secondary | ICD-10-CM

## 2019-04-05 LAB — PREGNANCY, URINE: Preg Test, Ur: POSITIVE — AB

## 2019-04-05 MED ORDER — PRENATAL VITAMIN 27-0.8 MG PO TABS: 1.0000 | ORAL_TABLET | Freq: Every day | ORAL | 0 refills | Status: AC

## 2019-04-05 NOTE — Progress Notes (Signed)
Pt unsure of where she wants to go for prenatal care, declined preadmit today. EDC 10/27/2019, EGA 10.5 weeks based on normal LMP of 01/20/2019.

## 2019-05-16 ENCOUNTER — Telehealth: Payer: Self-pay

## 2019-05-16 NOTE — Telephone Encounter (Signed)
Call to follow-up +PT-reports will be going to [facility] for care and has scheduled appt Debera Lat, RN

## 2019-06-01 DIAGNOSIS — R87629 Unspecified abnormal cytological findings in specimens from vagina: Secondary | ICD-10-CM

## 2019-06-01 HISTORY — DX: Unspecified abnormal cytological findings in specimens from vagina: R87.629

## 2023-01-06 ENCOUNTER — Ambulatory Visit (LOCAL_COMMUNITY_HEALTH_CENTER): Payer: Medicaid Other

## 2023-01-06 ENCOUNTER — Ambulatory Visit
Admission: EM | Admit: 2023-01-06 | Discharge: 2023-01-06 | Disposition: A | Discharge status: Home / Self Care | Payer: Medicaid Other

## 2023-01-06 VITALS — BP 96/63 | Ht 70.0 in | Wt 164.5 lb

## 2023-01-06 DIAGNOSIS — Z3201 Encounter for pregnancy test, result positive: Secondary | ICD-10-CM

## 2023-01-06 DIAGNOSIS — Z309 Encounter for contraceptive management, unspecified: Secondary | ICD-10-CM

## 2023-01-06 DIAGNOSIS — Z3A11 11 weeks gestation of pregnancy: Secondary | ICD-10-CM | POA: Diagnosis not present

## 2023-01-06 DIAGNOSIS — J069 Acute upper respiratory infection, unspecified: Secondary | ICD-10-CM

## 2023-01-06 DIAGNOSIS — Z331 Pregnant state, incidental: Secondary | ICD-10-CM | POA: Diagnosis not present

## 2023-01-06 LAB — PREGNANCY, URINE: Preg Test, Ur: POSITIVE — AB

## 2023-01-06 MED ORDER — PRENATAL VITAMINS 28-0.8 MG PO TABS: 28.0000 mg | ORAL_TABLET | Freq: Every day | ORAL | 0 refills | Status: AC

## 2023-01-06 NOTE — Progress Notes (Addendum)
UPT positive.  Plans prenatal care at [facility].  Positive pregnancy packet given and reviewed with pt.  Denies any problems. Thinking she may want tubal ligation after delivery.    The patient was dispensed prenatal vitamins today. I provided counseling today regarding the medication.. Patient given the opportunity to ask questions. Questions answered.    To clerk for presumptive eligibility and to schedule prenatal appt.    Cherlynn Polo, RN

## 2023-01-06 NOTE — Discharge Instructions (Addendum)
Follow up with your OB/GYN if you are not improving.

## 2023-01-06 NOTE — ED Provider Notes (Signed)
Renaldo Heath    CSN: [id number] Arrival date & time: 01/06/23  1047      History   Chief Complaint Chief Complaint  Patient presents with   Headache   Shortness of Breath   Cough   Nasal Congestion    HPI Samantha Heath is a 37 y.o. female.  Patient is [redacted] weeks pregnant.  She presents today with congestion, postnasal drip, runny nose, and cough x 4 days.  She occasionally feels short of breath when coughing.  Treating with Tylenol.  She denies fever, chills, chest pain, abdominal pain, vomiting, diarrhea, or other symptoms.    The history is provided by the patient and medical records.    Past Medical History:  Diagnosis Date   Bacterial vaginal infection    Flu vaccine need    at 16 weeks   H/O gonorrhea    History of gonorrhea 2011   Marijuana use    last used 02/2015   Nausea and vomiting during pregnancy    Need for Tdap vaccination    at 28 weeks    Patient Active Problem List   Diagnosis Date Noted   Labor and delivery, indication for care 10/28/2015   UTI in pregnancy 10/15/2015   H/O oligohydramnios in prior pregnancy, currently pregnant 10/15/2015   Pregnancy 10/13/2015   Irregular uterine contractions 10/13/2015   UTI (urinary tract infection) in pregnancy in third trimester 10/13/2015   Anemia of pregnancy in third trimester 10/13/2015   Poor weight gain of pregnancy 10/08/2015   Flu vaccine need 09/10/2015   Supervision of normal pregnancy in third trimester 07/06/2015   Marijuana abuse 05/23/2015    Past Surgical History:  Procedure Laterality Date   NO PAST SURGERIES      OB History     Gravida  5   Para  4   Term  4   Preterm  0   AB  0   Living  4      SAB  0   IAB  0   Ectopic  0   Multiple  0   Live Births  4        Obstetric Comments  Induction for oligohydramnios both pregnancies           Home Medications    Prior to Admission medications   Medication Sig Start Date End Date Taking?  Authorizing Provider  docusate sodium (COLACE) 100 MG capsule Take 1 capsule (100 mg total) by mouth 2 (two) times daily as needed. Patient not taking: Reported on 04/05/2019 10/29/15   Hildred Laser, MD  ferrous sulfate 325 (65 FE) MG tablet Take 1 tablet (325 mg total) by mouth 2 (two) times daily with a meal. Patient not taking: Reported on 04/05/2019 10/13/15   Hildred Laser, MD  HYDROcodone-acetaminophen (NORCO) 5-325 MG tablet Take 1 tablet by mouth every 6 (six) hours as needed. Patient not taking: Reported on 04/05/2019 03/16/16   Menshew, Charlesetta Ivory, PA-C  ibuprofen (ADVIL,MOTRIN) 800 MG tablet Take 1 tablet (800 mg total) by mouth every 8 (eight) hours as needed. Patient not taking: Reported on 04/05/2019 03/16/16   Menshew, Charlesetta Ivory, PA-C  norgestimate-ethinyl estradiol (SPRINTEC 28) 0.25-35 MG-MCG tablet Take 1 tablet by mouth daily. Patient not taking: Reported on 04/05/2019 02/06/17   Minna Antis, MD  Prenat-FeFum-FePo-FA-Omega 3 (CONCEPT DHA) 53.5-38-1 MG CAPS Take 53.5 mg by mouth daily. Patient not taking: Reported on 04/05/2019 08/27/15   Defrancesco, Prentice Docker, MD  Prenatal  Vit-Fe Fumarate-FA (PRENATAL VITAMINS) 28-0.8 MG TABS Take 28 mg by mouth daily. 01/06/23 04/16/23  Federico Flake, MD    Family History Family History  Problem Relation Age of Onset   Cancer Neg Hx    Diabetes Neg Hx    Heart disease Neg Hx     Social History Social History   Tobacco Use   Smoking status: Never   Smokeless tobacco: Never  Vaping Use   Vaping Use: Never used  Substance Use Topics   Alcohol use: Not Currently    Comment: states none since pregnant   Drug use: Not Currently    Types: Marijuana    Comment: last used MJ latter March 2024     Allergies   Patient has no known allergies.   Review of Systems Review of Systems  Constitutional:  Negative for chills and fever.  HENT:  Positive for congestion, postnasal drip and rhinorrhea. Negative for ear pain and  sore throat.   Respiratory:  Positive for cough and shortness of breath.   Cardiovascular:  Negative for chest pain and palpitations.  Gastrointestinal:  Negative for abdominal pain, diarrhea and vomiting.     Physical Exam Triage Vital Signs ED Triage Vitals  Enc Vitals Group     BP 01/06/23 1103 106/69     Pulse Rate 01/06/23 1054 86     Resp 01/06/23 1054 18     Temp 01/06/23 1054 98.1 F (36.7 C)     Temp src --      SpO2 01/06/23 1054 98 %     Weight --      Height --      Head Circumference --      Peak Flow --      Pain Score 01/06/23 1102 0     Pain Loc --      Pain Edu? --      Excl. in GC? --    No data found.  Updated Vital Signs BP 106/69 (BP Location: Left Arm)   Pulse 86   Temp 98.1 F (36.7 C)   Resp 18   LMP 10/20/2022 (Approximate)   SpO2 98%   Visual Acuity Right Eye Distance:   Left Eye Distance:   Bilateral Distance:    Right Eye Near:   Left Eye Near:    Bilateral Near:     Physical Exam Vitals and nursing note reviewed.  Constitutional:      General: She is not in acute distress.    Appearance: Normal appearance. She is well-developed. She is not ill-appearing.  HENT:     Head: Normocephalic and atraumatic.     Right Ear: Tympanic membrane normal.     Left Ear: Tympanic membrane normal.     Nose: Congestion and rhinorrhea present.     Mouth/Throat:     Mouth: Mucous membranes are moist.     Pharynx: Oropharynx is clear.  Cardiovascular:     Rate and Rhythm: Normal rate and regular rhythm.     Heart sounds: Normal heart sounds.  Pulmonary:     Effort: Pulmonary effort is normal. No respiratory distress.     Breath sounds: Normal breath sounds.  Musculoskeletal:     Cervical back: Neck supple.  Skin:    General: Skin is warm and dry.  Neurological:     Mental Status: She is alert.  Psychiatric:        Mood and Affect: Mood normal.        Behavior: Behavior  normal.      UC Treatments / Results  Labs (all labs ordered  are listed, but only abnormal results are displayed) Labs Reviewed - No data to display  EKG   Radiology No results found.  Procedures Procedures (including critical care time)  Medications Ordered in UC Medications - No data to display  Initial Impression / Assessment and Plan / UC Course  I have reviewed the triage vital signs and the nursing notes.  Pertinent labs & imaging results that were available during my care of the patient were reviewed by me and considered in my medical decision making (see chart for details).    [redacted] weeks pregnant. Viral URI.  Afebrile, VSS.  Lungs are clear and O2 sat is 98%.  Instructed patient to take OTC medications for symptom management only as directed by her OB/GYN.  Instructed patient to follow up with her OB/GYN if her symptoms are not improving.  She agrees to plan of care.    Final Clinical Impressions(s) / UC Diagnoses   Final diagnoses:  [redacted] weeks gestation of pregnancy  Viral URI     Discharge Instructions      Follow up with your OB/GYN if you are not improving.      ED Prescriptions   None    PDMP not reviewed this encounter.   Mickie Bail, NP 01/06/23 1128

## 2023-01-06 NOTE — ED Triage Notes (Signed)
Patient presents to UC for cough, SOB, nasal congestion, and HA. Taking tylenol.

## 2023-01-20 ENCOUNTER — Emergency Department
Admission: EM | Admit: 2023-01-20 | Discharge: 2023-01-20 | Disposition: A | Discharge status: Home / Self Care | Payer: Medicaid Other | Attending: Emergency Medicine | Admitting: Emergency Medicine

## 2023-01-20 ENCOUNTER — Encounter: Payer: Self-pay | Admitting: Intensive Care

## 2023-01-20 ENCOUNTER — Emergency Department: Payer: Medicaid Other

## 2023-01-20 ENCOUNTER — Other Ambulatory Visit: Payer: Self-pay

## 2023-01-20 DIAGNOSIS — D72829 Elevated white blood cell count, unspecified: Secondary | ICD-10-CM | POA: Diagnosis not present

## 2023-01-20 DIAGNOSIS — Z3A13 13 weeks gestation of pregnancy: Secondary | ICD-10-CM | POA: Insufficient documentation

## 2023-01-20 DIAGNOSIS — O26851 Spotting complicating pregnancy, first trimester: Secondary | ICD-10-CM | POA: Insufficient documentation

## 2023-01-20 DIAGNOSIS — O209 Hemorrhage in early pregnancy, unspecified: Secondary | ICD-10-CM

## 2023-01-20 LAB — ABO/RH: ABO/RH(D): B POS

## 2023-01-20 LAB — COMPREHENSIVE METABOLIC PANEL
ALT: 13 U/L (ref 0–44)
AST: 16 U/L (ref 15–41)
Albumin: 3.5 g/dL (ref 3.5–5.0)
Alkaline Phosphatase: 47 U/L (ref 38–126)
Anion gap: 9 (ref 5–15)
BUN: 10 mg/dL (ref 6–20)
CO2: 24 mmol/L (ref 22–32)
Calcium: 9.1 mg/dL (ref 8.9–10.3)
Chloride: 103 mmol/L (ref 98–111)
Creatinine, Ser: 0.6 mg/dL (ref 0.44–1.00)
GFR, Estimated: >60 mL/min (ref >60)
Glucose, Bld: 142 mg/dL — ABNORMAL HIGH (ref 70–99)
Potassium: 3.5 mmol/L (ref 3.5–5.1)
Sodium: 136 mmol/L (ref 135–145)
Total Bilirubin: 0.5 mg/dL (ref 0.3–1.2)
Total Protein: 7.1 g/dL (ref 6.5–8.1)

## 2023-01-20 LAB — LIPASE, BLOOD: Lipase: 28 U/L (ref 11–51)

## 2023-01-20 LAB — URINALYSIS, ROUTINE W REFLEX MICROSCOPIC
Bilirubin Urine: NEGATIVE
Glucose, UA: NEGATIVE mg/dL
Hgb urine dipstick: NEGATIVE
Ketones, ur: 5 mg/dL — AB
Nitrite: NEGATIVE
Protein, ur: NEGATIVE mg/dL
Specific Gravity, Urine: 1.023 (ref 1.005–1.030)
pH: 7 (ref 5.0–8.0)

## 2023-01-20 LAB — CBC
HCT: 36.2 % (ref 36.0–46.0)
Hemoglobin: 12.1 g/dL (ref 12.0–15.0)
MCH: 29.2 pg (ref 26.0–34.0)
MCHC: 33.4 g/dL (ref 30.0–36.0)
MCV: 87.4 fL (ref 80.0–100.0)
Platelets: 216 10*3/uL (ref 150–400)
RBC: 4.14 MIL/uL (ref 3.87–5.11)
RDW: 13.1 % (ref 11.5–15.5)
WBC: 6.3 10*3/uL (ref 4.0–10.5)
nRBC: 0 % (ref 0.0–0.2)

## 2023-01-20 LAB — POC URINE PREG, ED: Preg Test, Ur: POSITIVE — AB

## 2023-01-20 LAB — HCG, QUANTITATIVE, PREGNANCY: hCG, Beta Chain, Quant, S: 60924 m[IU]/mL — ABNORMAL HIGH (ref <5)

## 2023-01-20 NOTE — ED Notes (Signed)
Pt verbalizes understanding of discharge instructions. Opportunity for questioning and answers were provided. Pt discharged from ED to home.   ? ?

## 2023-01-20 NOTE — ED Provider Notes (Signed)
[facility] Provider Note    Event Date/Time   First MD Initiated Contact with Patient 01/20/23 1237     (approximate)   History   Threatened Miscarriage   HPI  Samantha Heath is a 37 y.o. female presents to the ED with concerns of possible miscarriage.  Patient states that she is [redacted] weeks pregnant and last evening she noted some blood in the toilet.  Patient states that she is still spotting this morning but has not actually used any pads.  She currently has an OB/GYN appointment scheduled for 01/25/2023.     Physical Exam   Triage Vital Signs: ED Triage Vitals  Enc Vitals Group     BP 01/20/23 1150 124/72     Pulse Rate 01/20/23 1150 92     Resp 01/20/23 1150 18     Temp 01/20/23 1150 99 F (37.2 C)     Temp Source 01/20/23 1150 Oral     SpO2 01/20/23 1150 99 %     Weight 01/20/23 1151 164 lb (74.4 kg)     Height 01/20/23 1151 5\' 10"  (1.778 m)     Head Circumference --      Peak Flow --      Pain Score 01/20/23 1150 5     Pain Loc --      Pain Edu? --      Excl. in GC? --     Most recent vital signs: Vitals:   01/20/23 1150 01/20/23 1420  BP: 124/72 101/65  Pulse: 92 88  Resp: 18 18  Temp: 99 F (37.2 C)   SpO2: 99% 100%     General: Awake, no distress.  CV:  Good peripheral perfusion.  Heart regular rate and rhythm. Resp:  Normal effort.  Lungs are clear bilaterally. Abd:  No distention.  Soft, nontender, bowel sounds normoactive x 4 quadrants. Other:     ED Results / Procedures / Treatments   Labs (all labs ordered are listed, but only abnormal results are displayed) Labs Reviewed  COMPREHENSIVE METABOLIC PANEL - Abnormal; Notable for the following components:      Result Value   Glucose, Bld 142 (*)    All other components within normal limits  URINALYSIS, ROUTINE W REFLEX MICROSCOPIC - Abnormal; Notable for the following components:   Color, Urine YELLOW (*)    APPearance CLEAR (*)    Ketones, ur 5 (*)     Leukocytes,Ua TRACE (*)    Bacteria, UA RARE (*)    All other components within normal limits  HCG, QUANTITATIVE, PREGNANCY - Abnormal; Notable for the following components:   hCG, Beta Chain, Quant, S 40,981 (*)    All other components within normal limits  POC URINE PREG, ED - Abnormal; Notable for the following components:   Preg Test, Ur POSITIVE (*)    All other components within normal limits  URINE CULTURE  LIPASE, BLOOD  CBC  ABO/RH      RADIOLOGY  Ultrasound OB less than 14 weeks per radiologist shows a single IUP at 13 weeks 1 day and a heart rate of 147.   PROCEDURES:  Critical Care performed:   Procedures   MEDICATIONS ORDERED IN ED: Medications - No data to display   IMPRESSION / MDM / ASSESSMENT AND PLAN / ED COURSE  I reviewed the triage vital signs and the nursing notes.   Differential diagnosis includes, but is not limited to, vaginal bleeding affecting pregnancy, threatened abortion, subchorionic hemorrhage.  37 year old female presents to the ED with complaint of vaginal bleeding and known pregnancy.  hCG was 60,924, CMP and CBC unremarkable, ABO Rh was to B positive.  Urinalysis showed trace leukocytes with rare bacteria and a urine culture was obtained.  Patient was made aware of her ultrasound.  She is encouraged to keep her appointment with the [facility] department on June 11.  She also was encouraged to return to the emergency department should her symptoms worsen or any other urgent concerns.       Patient's presentation is most consistent with acute presentation with potential threat to life or bodily function.  FINAL CLINICAL IMPRESSION(S) / ED DIAGNOSES   Final diagnoses:  Vaginal bleeding affecting early pregnancy     Rx / DC Orders   ED Discharge Orders     None        Note:  This document was prepared using Dragon voice recognition software and may include unintentional dictation errors.   Tommi Rumps,  PA-C 01/20/23 1713    Minna Antis, MD 01/20/23 [phone]

## 2023-01-20 NOTE — ED Triage Notes (Signed)
Patient reports she is [redacted] weeks pregnant. Last night went to the bathroom and reported blood in toilet. Reports she is still spotting this AM. C/o lower back pain and headache.  Reports first OBGYN appointment is 01/25/23

## 2023-01-20 NOTE — Discharge Instructions (Signed)
Keep your appointment with [facility] department for your OB/GYN appointment.

## 2023-01-21 LAB — URINE CULTURE
Culture: NO GROWTH
Special Requests: NORMAL

## 2023-01-25 ENCOUNTER — Encounter: Payer: Self-pay | Admitting: Family Medicine

## 2023-01-25 ENCOUNTER — Ambulatory Visit: Payer: Medicaid Other | Admitting: Family Medicine

## 2023-01-25 VITALS — BP 89/58 | HR 80 | Temp 97.6°F | Wt 171.0 lb

## 2023-01-25 DIAGNOSIS — R110 Nausea: Secondary | ICD-10-CM | POA: Insufficient documentation

## 2023-01-25 DIAGNOSIS — Z3481 Encounter for supervision of other normal pregnancy, first trimester: Secondary | ICD-10-CM | POA: Diagnosis not present

## 2023-01-25 DIAGNOSIS — F1291 Cannabis use, unspecified, in remission: Secondary | ICD-10-CM

## 2023-01-25 DIAGNOSIS — Z8742 Personal history of other diseases of the female genital tract: Secondary | ICD-10-CM

## 2023-01-25 DIAGNOSIS — Z23 Encounter for immunization: Secondary | ICD-10-CM | POA: Insufficient documentation

## 2023-01-25 DIAGNOSIS — Z3480 Encounter for supervision of other normal pregnancy, unspecified trimester: Secondary | ICD-10-CM

## 2023-01-25 LAB — WET PREP FOR TRICH, YEAST, CLUE
Trichomonas Exam: NEGATIVE
Yeast Exam: NEGATIVE

## 2023-01-25 LAB — HEMOGLOBIN, FINGERSTICK: Hemoglobin: 12.3 g/dL (ref 11.1–15.9)

## 2023-01-25 MED ORDER — ONDANSETRON HCL 4 MG PO TABS: 4.0000 mg | ORAL_TABLET | Freq: Every day | ORAL | 1 refills | Status: DC | PRN

## 2023-01-25 NOTE — Progress Notes (Addendum)
Presents for initiation of prenatal care. 01/06/23 to [facility] for evaluation of respiratory symptoms. Denies testing / medication prescription. States symptoms spontaneously resolved. To [facility] ED 01/20/23 due to vaginal bleeding and back pain. States no medications prescribed. Viable [place] = 13 weeks and 1 day. Desires Maternit-21 today. Counseled regarding flu vaccine and declined today. Jossie Ng, RN Wet prep negative and hgb = 12.3. No intervention required per standing order. Jossie Ng, RN

## 2023-01-25 NOTE — Progress Notes (Signed)
[facility]  Maternal Health Clinic   INITIAL PRENATAL VISIT NOTE  Subjective:  Samantha Heath is a 37 y.o. [id number] at [redacted]w[redacted]d being seen today to start prenatal care at the [facility] Department.  She is currently monitored for the following issues for this low-risk pregnancy and has History of marijuana use; H/O oligohydramnios in prior pregnancy, currently pregnant; Need for Tdap vaccination; Supervision of other normal pregnancy, antepartum; History of abnormal cervical Pap smear; and Nausea on their problem list.  Patient reports nausea.  Contractions: Not present. Vag. Bleeding: None.  Movement: Present. Denies leaking of fluid.   Indications for ASA therapy (per uptodate) One of the following: Previous pregnancy with preeclampsia, especially early onset and with an adverse outcome No Multifetal gestation No Chronic hypertension No Type 1 or 2 diabetes mellitus No Chronic kidney disease No Autoimmune disease (antiphospholipid syndrome, systemic lupus erythematosus) No  Two or more of the following: Nulliparity No Obesity (body mass index >30 kg/m2) No Family history of preeclampsia in mother or sister No Age ?35 years No Sociodemographic characteristics (African American race, low socioeconomic level) Yes Personal risk factors (eg, previous pregnancy with low birth weight or small for gestational age infant, previous adverse pregnancy outcome [eg, stillbirth], interval >10 years between pregnancies) No   The following portions of the patient's history were reviewed and updated as appropriate: allergies, current medications, past family history, past medical history, past social history, past surgical history and problem list. Problem list updated.  Objective:   Vitals:   01/25/23 0848  BP: (!) 89/58  Pulse: 80  Temp: 97.6 F (36.4 C)  Weight: 171 lb (77.6 kg)    Fetal Status: Fetal Heart Rate (bpm): 153 Fundal Height: 13 cm Movement:  Present      Physical Exam Vitals and nursing note reviewed.  Constitutional:      General: She is not in acute distress.    Appearance: Normal appearance. She is well-developed.  HENT:     Head: Normocephalic and atraumatic.     Right Ear: External ear normal.     Left Ear: External ear normal.     Nose: Nose normal. No congestion or rhinorrhea.     Mouth/Throat:     Lips: Pink.     Mouth: Mucous membranes are moist.     Dentition: Normal dentition. No dental caries.     Pharynx: Oropharynx is clear. Uvula midline.     Comments: Dentition: fair Eyes:     General: No scleral icterus.    Conjunctiva/sclera: Conjunctivae normal.  Neck:     Thyroid: No thyroid mass or thyromegaly.  Cardiovascular:     Rate and Rhythm: Normal rate.     Pulses: Normal pulses.     Comments: Extremities are warm and well perfused Pulmonary:     Effort: Pulmonary effort is normal.     Breath sounds: Normal breath sounds.  Chest:     Chest wall: No mass.  Breasts:    Tanner Score is 5.     Breasts are symmetrical.     Right: Normal. No mass, nipple discharge or skin change.     Left: Normal. No mass, nipple discharge or skin change.  Abdominal:     General: Abdomen is flat.     Palpations: Abdomen is soft.     Tenderness: There is no abdominal tenderness.     Comments: Gravid   Genitourinary:    General: Normal vulva.     Exam position:  Lithotomy position.     Pubic Area: No rash.      Labia:        Right: No rash.        Left: No rash.      Vagina: Normal. No vaginal discharge.     Cervix: Friability present. No cervical motion tenderness.     Uterus: Normal. Enlarged (Gravid 12-13 size). Not tender.      Adnexa: Right adnexa normal and left adnexa normal.     Rectum: Normal. No external hemorrhoid.  Musculoskeletal:     Right lower leg: No edema.     Left lower leg: No edema.  Lymphadenopathy:     Cervical: No cervical adenopathy.     Upper Body:     Right upper body: No axillary  adenopathy.     Left upper body: No axillary adenopathy.  Skin:    General: Skin is warm.     Capillary Refill: Capillary refill takes less than 2 seconds.  Neurological:     Mental Status: She is alert.     Assessment and Plan:  Pregnancy: G5P4004 at [redacted]w[redacted]d  1. Supervision of other normal pregnancy, antepartum 37 year old female in clinic today for prenatal care. Patient excited about the pregnancy- states that at first she didn't want to be pregnant. Lives with 4 children. Works at a [employer] -Patient states last LMP 10/20/22, dating U/S done recently when she went to the hospital for vaginal bleeding- she states she was told she has a cyst on her ovary but this is not reflected in the ultrasound report  -Patient states she is taking PNV daily. -Patient received dental care years ago.  Patient given dental information.   -Agrees to Maternti21 -Patient given information about COVID and Flu- declined both.  -ASA not indicated.  -total weight gain of 25-35 discussed today -PHQ-9= 3, will continue to monitor.  -Hgb= wnl   - Hemoglobin, fingerstick - 161096 7+Oxycodone-Bund - WET PREP FOR TRICH, YEAST, CLUE - Prenatal Profile I - QuantiFERON-TB Gold Plus - MaterniT 21 plus Core, Blood - ondansetron (ZOFRAN) 4 MG tablet; Take 1 tablet (4 mg total) by mouth daily as needed for nausea or vomiting.  Dispense: 30 tablet; Refill: 1  2. Nausea -Rx written for zofran -Patient reports some nausea and vomiting.  Patient encouraged to eat meals high in protein every 2 hours for nausea and also given resource sheet.   3. 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 History of marijuana use -denies current use-- states she quit when she found out she was pregnant -Agrees to UDS today.      Discussed overview of care and coordination with inpatient delivery practices including [facility],   [facility].    Preterm labor symptoms and general obstetric precautions including but not limited to vaginal bleeding, contractions, leaking of fluid and fetal movement were reviewed in detail with the patient.  Please refer to After Visit Summary for other counseling recommendations.   Return in about 4 weeks (around 02/22/2023) for Routine Prenatal Care.  No future appointments.  Lenice Llamas, [place]

## 2023-01-26 LAB — 789231 7+OXYCODONE-BUND
Amphetamines, Urine: NEGATIVE ng/mL
BENZODIAZ UR QL: NEGATIVE ng/mL
Barbiturate screen, urine: NEGATIVE ng/mL
Cannabinoid Quant, Ur: NEGATIVE ng/mL
Cocaine (Metab.): NEGATIVE ng/mL
OPIATE SCREEN URINE: NEGATIVE ng/mL
Oxycodone/Oxymorphone, Urine: NEGATIVE ng/mL
PCP Quant, Ur: NEGATIVE ng/mL

## 2023-01-28 LAB — IGP, APTIMA HPV
HPV Aptima: NEGATIVE
PAP Smear Comment: 0

## 2023-01-29 LAB — PREGNANCY, INITIAL SCREEN
Antibody Screen: NEGATIVE
Basophils Absolute: 0 10*3/uL (ref 0.0–0.2)
Basos: 0 %
Bilirubin, UA: NEGATIVE
Chlamydia trachomatis, NAA: NEGATIVE
EOS (ABSOLUTE): 0.1 10*3/uL (ref 0.0–0.4)
Eos: 2 %
Glucose, UA: NEGATIVE
HCV Ab: NONREACTIVE
HIV Screen 4th Generation wRfx: NONREACTIVE
Hematocrit: 36.5 % (ref 34.0–46.6)
Hemoglobin: 12.3 g/dL (ref 11.1–15.9)
Hepatitis B Surface Ag: NEGATIVE
Immature Grans (Abs): 0 10*3/uL (ref 0.0–0.1)
Immature Granulocytes: 0 %
Ketones, UA: NEGATIVE
Leukocytes,UA: NEGATIVE
Lymphocytes Absolute: 1.4 10*3/uL (ref 0.7–3.1)
Lymphs: 25 %
MCH: 29.1 pg (ref 26.6–33.0)
MCHC: 33.7 g/dL (ref 31.5–35.7)
MCV: 86 fL (ref 79–97)
Monocytes Absolute: 0.4 10*3/uL (ref 0.1–0.9)
Monocytes: 7 %
Neisseria Gonorrhoeae by PCR: NEGATIVE
Neutrophils Absolute: 3.7 10*3/uL (ref 1.4–7.0)
Neutrophils: 66 %
Nitrite, UA: NEGATIVE
Platelets: 253 10*3/uL (ref 150–450)
Protein,UA: NEGATIVE
RBC, UA: NEGATIVE
RBC: 4.23 x10E6/uL (ref 3.77–5.28)
RDW: 12.4 % (ref 11.7–15.4)
RPR Ser Ql: NONREACTIVE
Rh Factor: POSITIVE
Rubella Antibodies, IGG: 5.46 index (ref >0.99)
Specific Gravity, UA: 1.016 (ref 1.005–1.030)
Urobilinogen, Ur: 0.2 mg/dL (ref 0.2–1.0)
WBC: 5.6 10*3/uL (ref 3.4–10.8)
pH, UA: 8 — ABNORMAL HIGH (ref 5.0–7.5)

## 2023-01-29 LAB — QUANTIFERON-TB GOLD PLUS
QuantiFERON Mitogen Value: >10 IU/mL
QuantiFERON Nil Value: 0.08 IU/mL
QuantiFERON TB1 Ag Value: 0.1 IU/mL
QuantiFERON TB2 Ag Value: 0.08 IU/mL
QuantiFERON-TB Gold Plus: NEGATIVE

## 2023-01-29 LAB — MICROSCOPIC EXAMINATION
Bacteria, UA: NONE SEEN
Casts: NONE SEEN /lpf
RBC, Urine: NONE SEEN /hpf (ref 0–2)
WBC, UA: NONE SEEN /hpf (ref 0–5)

## 2023-01-29 LAB — MATERNIT 21 PLUS CORE, BLOOD
Fetal Fraction: 6
Result (T21): NEGATIVE
Trisomy 13 (Patau syndrome): NEGATIVE
Trisomy 18 (Edwards syndrome): NEGATIVE
Trisomy 21 (Down syndrome): NEGATIVE

## 2023-01-29 LAB — HCV INTERPRETATION

## 2023-01-29 LAB — URINE CULTURE, OB REFLEX

## 2023-02-22 ENCOUNTER — Ambulatory Visit: Payer: Medicaid Other | Admitting: Advanced Practice Midwife

## 2023-02-22 VITALS — BP 95/57 | HR 83 | Temp 98.7°F | Wt 175.0 lb

## 2023-02-22 DIAGNOSIS — Z3480 Encounter for supervision of other normal pregnancy, unspecified trimester: Secondary | ICD-10-CM

## 2023-02-22 DIAGNOSIS — Z8742 Personal history of other diseases of the female genital tract: Secondary | ICD-10-CM

## 2023-02-22 DIAGNOSIS — Z3482 Encounter for supervision of other normal pregnancy, second trimester: Secondary | ICD-10-CM

## 2023-02-22 NOTE — Progress Notes (Signed)
[facility] Department Maternal Health Clinic  PRENATAL VISIT NOTE  Subjective:  Samantha Heath is a 37 y.o. [id number] at [redacted]w[redacted]d being seen today for ongoing prenatal care.  She is currently monitored for the following issues for this low-risk pregnancy and has History of marijuana use; H/O oligohydramnios in prior pregnancy, currently pregnant; Need for Tdap vaccination; Supervision of other normal pregnancy, antepartum; History of abnormal cervical Pap smear; and Nausea on their problem list.  Patient reports nausea.  Contractions: Not present. Vag. Bleeding: None.  Movement: Present. Denies leaking of fluid/ROM.   The following portions of the patient's history were reviewed and updated as appropriate: allergies, current medications, past family history, past medical history, past social history, past surgical history and problem list. Problem list updated.  Objective:   Vitals:   02/22/23 1347  BP: (!) 95/57  Pulse: 83  Temp: 98.7 F (37.1 C)  Weight: 175 lb (79.4 kg)    Fetal Status: Fetal Heart Rate (bpm): 135 Fundal Height: 19 cm Movement: Present     General:  Alert, oriented and cooperative. Patient is in no acute distress.  Skin: Skin is warm and dry. No rash noted.   Cardiovascular: Normal heart rate noted  Respiratory: Normal respiratory effort, no problems with respiration noted  Abdomen: Soft, gravid, appropriate for gestational age.  Pain/Pressure: Absent     Pelvic: Cervical exam deferred        Extremities: Normal range of motion.  Edema: None  Mental Status: Normal mood and affect. Normal behavior. Normal judgment and thought content.   Assessment and Plan:  Pregnancy: G5P4004 at [redacted]w[redacted]d  1. 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 History of abnormal cervical Pap smear 06/01/2019 LSIL HPV+ 01/25/23 neg HPV neg Needs pap in 1 year   Preterm labor symptoms and general obstetric precautions including but not limited to vaginal bleeding, contractions, leaking of fluid and fetal movement were reviewed in detail with the patient. Please refer to After Visit Summary for other counseling recommendations.  Return in about 4 weeks (around 03/22/2023) for routine PNC.  Future Appointments  Date Time Provider Department Center  03/22/2023  1:20 PM [facility] PROVIDER [facility] None    Alberteen Spindle, CNM

## 2023-02-22 NOTE — Progress Notes (Signed)
Patient here for MH RV at 17 6/7. Desires AFP only today.Burt Knack, RN

## 2023-02-24 LAB — AFP, SERUM, OPEN SPINA BIFIDA
AFP MoM: 1.55
AFP Value: 64.7 ng/mL
Gest. Age on Collection Date: 17.6 weeks
Maternal Age At EDD: 35.4 yr
OSBR Risk 1 IN: 4785
Test Results:: NEGATIVE
Weight: 175 [lb_av]

## 2023-03-22 ENCOUNTER — Ambulatory Visit: Payer: Medicaid Other | Admitting: Advanced Practice Midwife

## 2023-03-22 VITALS — BP 104/61 | HR 95 | Temp 99.4°F | Wt 182.6 lb

## 2023-03-22 DIAGNOSIS — Z3480 Encounter for supervision of other normal pregnancy, unspecified trimester: Secondary | ICD-10-CM

## 2023-03-22 DIAGNOSIS — Z3482 Encounter for supervision of other normal pregnancy, second trimester: Secondary | ICD-10-CM

## 2023-03-22 NOTE — Progress Notes (Signed)
[facility] Department Maternal Health Clinic  PRENATAL VISIT NOTE  Subjective:  Samantha Heath is a 37 y.o. [id number] at [redacted]w[redacted]d being seen today for ongoing prenatal care.  She is currently monitored for the following issues for this low-risk pregnancy and has History of marijuana use; H/O oligohydramnios in prior pregnancy, currently pregnant; Need for Tdap vaccination; Supervision of other normal pregnancy, antepartum; History of abnormal cervical Pap smear; and Nausea on their problem list.  Patient reports no complaints.  Contractions: Not present. Vag. Bleeding: None.  Movement: Present. Denies leaking of fluid/ROM.   The following portions of the patient's history were reviewed and updated as appropriate: allergies, current medications, past family history, past medical history, past social history, past surgical history and problem list. Problem list updated.  Objective:   Vitals:   03/22/23 1317  BP: 104/61  Pulse: 95  Temp: 99.4 F (37.4 C)  Weight: 182 lb 9.6 oz (82.8 kg)    Fetal Status: Fetal Heart Rate (bpm): 140 Fundal Height: 23 cm Movement: Present     General:  Alert, oriented and cooperative. Patient is in no acute distress.  Skin: Skin is warm and dry. No rash noted.   Cardiovascular: Normal heart rate noted  Respiratory: Normal respiratory effort, no problems with respiration noted  Abdomen: Soft, gravid, appropriate for gestational age.  Pain/Pressure: Absent     Pelvic: Cervical exam deferred        Extremities: Normal range of motion.  Edema: None  Mental Status: Normal mood and affect. Normal behavior. Normal judgment and thought content.   Assessment and Plan:  Pregnancy: G5P4004 at [redacted]w[redacted]d  1. Supervision of other normal pregnancy, antepartum Needs anatomy u/s--ordered 22 lb 9.6 oz (10.3 kg) Gained 7 lb in last 4 wks Not exercising; strongly encouraged to do so 3-4x/wk x 20 min Reviewed first and only u/s on 01/20/23 @13  1/7 Has dentist apt  03/28/23 Taking vits daily now! Working 40 hrs/wk   Preterm labor symptoms and general obstetric precautions including but not limited to vaginal bleeding, contractions, leaking of fluid and fetal movement were reviewed in detail with the patient. Please refer to After Visit Summary for other counseling recommendations.  Return in about 4 weeks (around 04/19/2023) for routine PNC.  No future appointments.  Alberteen Spindle, CNM

## 2023-03-22 NOTE — Progress Notes (Signed)
Here today for 21.6 week MH RV. Taking PNV every day. Denies ED/hospital visits since last RV. Needs anatomy scan. Tawny Hopping, RN

## 2023-04-19 ENCOUNTER — Ambulatory Visit: Payer: Medicaid Other | Admitting: Advanced Practice Midwife

## 2023-04-19 VITALS — BP 111/69 | HR 91 | Temp 98.0°F | Wt 186.0 lb

## 2023-04-19 DIAGNOSIS — Z3480 Encounter for supervision of other normal pregnancy, unspecified trimester: Secondary | ICD-10-CM

## 2023-04-19 DIAGNOSIS — R9389 Abnormal findings on diagnostic imaging of other specified body structures: Secondary | ICD-10-CM

## 2023-04-19 DIAGNOSIS — O99012 Anemia complicating pregnancy, second trimester: Secondary | ICD-10-CM

## 2023-04-19 DIAGNOSIS — Z3482 Encounter for supervision of other normal pregnancy, second trimester: Secondary | ICD-10-CM

## 2023-04-19 HISTORY — DX: Abnormal findings on diagnostic imaging of other specified body structures: R93.89

## 2023-04-19 LAB — URINALYSIS
Bilirubin, UA: NEGATIVE
Glucose, UA: NEGATIVE
Ketones, UA: NEGATIVE
Nitrite, UA: NEGATIVE
Protein,UA: NEGATIVE
RBC, UA: NEGATIVE
Specific Gravity, UA: 1.02 (ref 1.005–1.030)
Urobilinogen, Ur: 0.2 mg/dL (ref 0.2–1.0)
pH, UA: 7 (ref 5.0–7.5)

## 2023-04-19 LAB — HEMOGLOBIN, FINGERSTICK: Hemoglobin: 10 g/dL — ABNORMAL LOW (ref 11.1–15.9)

## 2023-04-19 MED ORDER — IRON (FERROUS SULFATE) 325 (65 FE) MG PO TABS
325.0000 mg | ORAL_TABLET | Freq: Two times a day (BID) | ORAL | Status: AC
Start: 2023-04-19 — End: ?

## 2023-04-19 NOTE — Progress Notes (Addendum)
In house lab results reviewed during visit. Unable to add on Anemia Panel to labs due to fingerstick only ordered, E. Sciora aware and to order next RV. Anemia in Pregnancy pamphlet given. The patient was dispensed Iron today. I provided counseling today regarding the medication. We discussed the medication, the side effects and when to call clinic. Patient given the opportunity to ask questions. Questions answered. Copy of signed BTL given to patient. Copy of work note given to patient. [facility] Forms completed. BTHIELE RN

## 2023-04-19 NOTE — Progress Notes (Signed)
[facility] Department Maternal Health Clinic  PRENATAL VISIT NOTE  Subjective:  Samantha Heath is a 37 y.o. O1H0865 at [redacted]w[redacted]d being seen today for ongoing prenatal care.  She is currently monitored for the following issues for this low-risk pregnancy and has History of marijuana use; H/O oligohydramnios in prior pregnancy, currently pregnant; Need for Tdap vaccination; Supervision of other normal pregnancy, antepartum; History of abnormal cervical Pap smear; and Nausea on their problem list.  Patient reports backache and fatigue.  Contractions: Not present. Vag. Bleeding: None.  Movement: Present. Denies leaking of fluid/ROM.   The following portions of the patient's history were reviewed and updated as appropriate: allergies, current medications, past family history, past medical history, past social history, past surgical history and problem list. Problem list updated.  Objective:   Vitals:   04/19/23 1311  BP: 111/69  Pulse: 91  Temp: 98 F (36.7 C)  Weight: 186 lb (84.4 kg)    Fetal Status: Fetal Heart Rate (bpm): 135 Fundal Height: 26 cm Movement: Present     General:  Alert, oriented and cooperative. Patient is in no acute distress.  Skin: Skin is warm and dry. No rash noted.   Cardiovascular: Normal heart rate noted  Respiratory: Normal respiratory effort, no problems with respiration noted  Abdomen: Soft, gravid, appropriate for gestational age.  Pain/Pressure: Absent     Pelvic: Cervical exam deferred        Extremities: Normal range of motion.  Edema: None  Mental Status: Normal mood and affect. Normal behavior. Normal judgment and thought content.   Assessment and Plan:  Pregnancy: G5P4004 at [redacted]w[redacted]d  1. Supervision of other normal pregnancy, antepartum Signed BTL papers today 26 lb (11.8 kg) 4 lb wt gain in last 4 wks Here with 42 yo daughter Reviewed 03/30/23 u/s at 23 4/7 with EFW=56%, AFI wnl, posterior placenta, 3VC, left fetal UTD 5.5 mm Has f/u  u/s 06/08/23 Working 40-43 hrs/wk at [employer] and asking for another work note--given [facility] dental apt 03/28/23 due to 37 yo seen in ER; rescheduled for 05/24/23 Not exercising--c/o fatigue To check Hgb today; denies ice pica N&V resolved  - Urinalysis (Urine Dip) - [id number] 7+Alc-Unbund - Hemoglobin, venipuncture  2. UTD fetal left kidney 5.5 mm F/u u/s 06/08/23  - Urinalysis (Urine Dip) - [id number] 7+Alc-Unbund - Hemoglobin, venipuncture   Preterm labor symptoms and general obstetric precautions including but not limited to vaginal bleeding, contractions, leaking of fluid and fetal movement were reviewed in detail with the patient. Please refer to After Visit Summary for other counseling recommendations.  Return in about 2 weeks (around 05/03/2023) for 28 week labs, routine PNC.  No future appointments.  Alberteen Spindle, CNM

## 2023-04-20 LAB — 726778 7+ALC-UNBUND
Amphetamines, Urine: NEGATIVE ng/mL
Barbiturate Quant, Ur: NEGATIVE ng/mL
Benzodiazepine Quant, Ur: NEGATIVE ng/mL
Cannabinoid Quant, Ur: NEGATIVE ng/mL
Cocaine (Metab.): NEGATIVE ng/mL
Ethanol, Urine: NEGATIVE %
Opiate Quant, Ur: NEGATIVE ng/mL
PCP Quant, Ur: NEGATIVE ng/mL

## 2023-05-03 ENCOUNTER — Ambulatory Visit: Payer: Medicaid Other | Admitting: Advanced Practice Midwife

## 2023-05-03 VITALS — BP 111/60 | HR 96 | Temp 98.4°F | Wt 189.0 lb

## 2023-05-03 DIAGNOSIS — Z3483 Encounter for supervision of other normal pregnancy, third trimester: Secondary | ICD-10-CM

## 2023-05-03 DIAGNOSIS — Z3480 Encounter for supervision of other normal pregnancy, unspecified trimester: Secondary | ICD-10-CM

## 2023-05-03 DIAGNOSIS — Z23 Encounter for immunization: Secondary | ICD-10-CM | POA: Diagnosis not present

## 2023-05-03 DIAGNOSIS — F5089 Other specified eating disorder: Secondary | ICD-10-CM

## 2023-05-03 DIAGNOSIS — O09299 Supervision of pregnancy with other poor reproductive or obstetric history, unspecified trimester: Secondary | ICD-10-CM

## 2023-05-03 DIAGNOSIS — Z8742 Personal history of other diseases of the female genital tract: Secondary | ICD-10-CM

## 2023-05-03 DIAGNOSIS — R9389 Abnormal findings on diagnostic imaging of other specified body structures: Secondary | ICD-10-CM

## 2023-05-03 NOTE — Progress Notes (Signed)
[facility] Department Maternal Health Clinic  PRENATAL VISIT NOTE  Subjective:  Samantha Heath is a 37 y.o. [id number] at [redacted]w[redacted]d being seen today for ongoing prenatal care.  She is currently monitored for the following issues for this low-risk pregnancy and has History of marijuana use; H/O oligohydramnios in prior pregnancy, currently pregnant; Need for Tdap vaccination; Supervision of other normal pregnancy, antepartum; History of abnormal cervical Pap smear; Nausea; and UTD fetal left kidney 5.5 mm on their problem list.  Patient reports  stuffy nose since yesterday .  Contractions: Not present. Vag. Bleeding: None.  Movement: Present. Denies leaking of fluid/ROM.   The following portions of the patient's history were reviewed and updated as appropriate: allergies, current medications, past family history, past medical history, past social history, past surgical history and problem list. Problem list updated.  Objective:   Vitals:   05/03/23 0906  BP: 111/60  Pulse: 96  Temp: 98.4 F (36.9 C)  Weight: 189 lb (85.7 kg)    Fetal Status: Fetal Heart Rate (bpm): 137 Fundal Height: 29 cm Movement: Present     General:  Alert, oriented and cooperative. Patient is in no acute distress.  Skin: Skin is warm and dry. No rash noted.   Cardiovascular: Normal heart rate noted  Respiratory: Normal respiratory effort, no problems with respiration noted  Abdomen: Soft, gravid, appropriate for gestational age.  Pain/Pressure: Absent     Pelvic: Cervical exam deferred        Extremities: Normal range of motion.  Edema: None  Mental Status: Normal mood and affect. Normal behavior. Normal judgment and thought content.   Assessment and Plan:  Pregnancy: G5P4004 at [redacted]w[redacted]d  1. Supervision of other normal pregnancy, antepartum 29 lb (13.2 kg) 3 lb wt gain in last 2 wks 1 hour glucola today Has f/u u/s 06/08/23 [facility] dentist apt on 03/28/23 and has f/u 06/08/23 Not exercising; encouraged  to do so Taking FeSo4 I daily with oj Eating few ice cubes/day Working 40 hrs/wk at [employer] now C/o stuffy nose yesterday only and said her daughter; to do covid test today Taking FeSo4 I daily with oj  - Glucose, 1 hour gestational - HIV Antibody (routine testing w rflx) - RPR - Anemia Profile A  2. H/O oligohydramnios in prior pregnancy, currently pregnant Monitor  - Glucose, 1 hour gestational - HIV Antibody (routine testing w rflx) - RPR - Anemia Profile A   3. UTD fetal left kidney 5.5 mm Dx'd on 03/30/23 u/s at 23 4/7 Has f/u u/s 06/08/23  4. History of abnormal cervical Pap smear 06/01/19 LSIL HPV+ no f/u 01/25/23 neg HPV - Next cotest 01/25/24    Preterm labor symptoms and general obstetric precautions including but not limited to vaginal bleeding, contractions, leaking of fluid and fetal movement were reviewed in detail with the patient. Please refer to After Visit Summary for other counseling recommendations.  Return in about 2 weeks (around 05/17/2023) for routine PNC.  No future appointments.  Alberteen Spindle, CNM

## 2023-05-03 NOTE — Progress Notes (Addendum)
Here today for 27.6 week MH RV. Taking PNV and Iron every day. Denies ED/hospital visits since last RV. 28 week labs and Tdap today. Tdap given. Declines Flu vaccine today requests to wait until next appt. Tawny Hopping, RN

## 2023-05-05 ENCOUNTER — Telehealth: Payer: Self-pay

## 2023-05-05 DIAGNOSIS — R7309 Other abnormal glucose: Secondary | ICD-10-CM | POA: Insufficient documentation

## 2023-05-05 LAB — ANEMIA PROFILE A
Basophils Absolute: 0 10*3/uL (ref 0.0–0.2)
Basos: 0 %
EOS (ABSOLUTE): 0.1 10*3/uL (ref 0.0–0.4)
Eos: 3 %
Hematocrit: 30 % — ABNORMAL LOW (ref 34.0–46.6)
Hemoglobin: 9.9 g/dL — ABNORMAL LOW (ref 11.1–15.9)
Immature Grans (Abs): 0 10*3/uL (ref 0.0–0.1)
Immature Granulocytes: 0 %
Iron Saturation: 8 % — CL (ref 15–55)
Iron: 35 ug/dL (ref 27–159)
Lymphocytes Absolute: 1.1 10*3/uL (ref 0.7–3.1)
Lymphs: 21 %
MCH: 28.6 pg (ref 26.6–33.0)
MCHC: 33 g/dL (ref 31.5–35.7)
MCV: 87 fL (ref 79–97)
Monocytes Absolute: 0.4 10*3/uL (ref 0.1–0.9)
Monocytes: 7 %
Neutrophils Absolute: 3.6 10*3/uL (ref 1.4–7.0)
Neutrophils: 69 %
Platelets: 215 10*3/uL (ref 150–450)
RBC: 3.46 x10E6/uL — ABNORMAL LOW (ref 3.77–5.28)
RDW: 11.9 % (ref 11.7–15.4)
Retic Ct Pct: 1.8 % (ref 0.6–2.6)
Total Iron Binding Capacity: 458 ug/dL — ABNORMAL HIGH (ref 250–450)
UIBC: 423 ug/dL (ref 131–425)
WBC: 5.2 10*3/uL (ref 3.4–10.8)

## 2023-05-05 LAB — RPR: RPR Ser Ql: NONREACTIVE

## 2023-05-05 LAB — GLUCOSE, 1 HOUR GESTATIONAL: Gestational Diabetes Screen: 138 mg/dL (ref 70–139)

## 2023-05-05 LAB — HIV ANTIBODY (ROUTINE TESTING W REFLEX): HIV Screen 4th Generation wRfx: NONREACTIVE

## 2023-05-05 NOTE — Telephone Encounter (Signed)
Phone call to patient to inform of need of 3hgtt.  No answer, LMTC. Tawny Hopping, RN

## 2023-05-09 NOTE — Telephone Encounter (Signed)
Patient notified of need for 3 HR GTT this week. Instructions given for NPO after 12 mn the evening before testing. Patient verbalized understanding. BTHIELE RN

## 2023-05-12 ENCOUNTER — Other Ambulatory Visit: Payer: Medicaid Other

## 2023-05-12 DIAGNOSIS — R7309 Other abnormal glucose: Secondary | ICD-10-CM

## 2023-05-12 NOTE — Progress Notes (Signed)
Here for 3 hr GTT.   One hour GTT was 138 mg/dL  NPO since 7 pm 04/29/77.  Instructions given and pt verbalized understanding.  To inform us if N/V while here.    [facility] RV appt 05/17/23 and pt aware of date.  Cherlynn Polo, RN

## 2023-05-13 LAB — GESTATIONAL GLUCOSE TOLERANCE
Glucose, Fasting: 89 mg/dL (ref 70–94)
Glucose, GTT - 1 Hour: 146 mg/dL (ref 70–179)
Glucose, GTT - 2 Hour: 88 mg/dL (ref 70–154)
Glucose, GTT - 3 Hour: 85 mg/dL (ref 70–139)

## 2023-05-17 ENCOUNTER — Ambulatory Visit: Payer: Medicaid Other | Admitting: Advanced Practice Midwife

## 2023-05-17 VITALS — BP 103/68 | HR 85 | Temp 98.2°F | Wt 189.2 lb

## 2023-05-17 DIAGNOSIS — F1291 Cannabis use, unspecified, in remission: Secondary | ICD-10-CM

## 2023-05-17 DIAGNOSIS — Z23 Encounter for immunization: Secondary | ICD-10-CM | POA: Diagnosis not present

## 2023-05-17 DIAGNOSIS — Z3483 Encounter for supervision of other normal pregnancy, third trimester: Secondary | ICD-10-CM

## 2023-05-17 DIAGNOSIS — R7309 Other abnormal glucose: Secondary | ICD-10-CM

## 2023-05-17 DIAGNOSIS — R9389 Abnormal findings on diagnostic imaging of other specified body structures: Secondary | ICD-10-CM

## 2023-05-17 DIAGNOSIS — Z3480 Encounter for supervision of other normal pregnancy, unspecified trimester: Secondary | ICD-10-CM

## 2023-05-17 DIAGNOSIS — O99013 Anemia complicating pregnancy, third trimester: Secondary | ICD-10-CM

## 2023-05-17 LAB — HEMOGLOBIN, FINGERSTICK: Hemoglobin: 9.6 g/dL — ABNORMAL LOW (ref 11.1–15.9)

## 2023-05-17 NOTE — Progress Notes (Signed)
Correctly verbalizes how to take iron tablet and prenatal vitamin. Taking iron with juice. Hgb today. Aware of [facility] [place] 06/08/23. Counseled on recommendation for flu vaccine in pregnancy and for those 57 months of age and older. Tolerated injection without complaint. Verified has [facility] contact card. Jossie Ng, RN Hgb = 9.6 and client counseled to increase iron to BID. Understanding verbalized. Hazle Coca CNM notified of hgb result. Jossie Ng, RN

## 2023-05-17 NOTE — Progress Notes (Signed)
[facility] Department Maternal Health Clinic  PRENATAL VISIT NOTE  Subjective:  Samantha Heath is a 37 y.o. [id number] at [redacted]w[redacted]d being seen today for ongoing prenatal care.  She is currently monitored for the following issues for this low-risk pregnancy and has History of marijuana use; Anemia in pregnancy, third trimester; H/O oligohydramnios in prior pregnancy, currently pregnant; Need for Tdap vaccination; Supervision of other normal pregnancy, antepartum; History of abnormal cervical Pap smear; UTD fetal left kidney 5.5 mm; Pica ice; and Elevated glucose tolerance test 05/03/23 1 hour=138 on their problem list.  Patient reports no complaints.  Contractions: Not present. Vag. Bleeding: None.  Movement: Present. Denies leaking of fluid/ROM.   The following portions of the patient's history were reviewed and updated as appropriate: allergies, current medications, past family history, past medical history, past social history, past surgical history and problem list. Problem list updated.  Objective:   Vitals:   05/17/23 1340  BP: 103/68  Pulse: 85  Temp: 98.2 F (36.8 C)  Weight: 189 lb 3.2 oz (85.8 kg)    Fetal Status: Fetal Heart Rate (bpm): 130 Fundal Height: 31 cm Movement: Present     General:  Alert, oriented and cooperative. Patient is in no acute distress.  Skin: Skin is warm and dry. No rash noted.   Cardiovascular: Normal heart rate noted  Respiratory: Normal respiratory effort, no problems with respiration noted  Abdomen: Soft, gravid, appropriate for gestational age.  Pain/Pressure: Absent     Pelvic: Cervical exam deferred        Extremities: Normal range of motion.  Edema: None  Mental Status: Normal mood and affect. Normal behavior. Normal judgment and thought content.   Assessment and Plan:  Pregnancy: G5P4004 at [redacted]w[redacted]d  1. Anemia in pregnancy, third trimester Taking FeSo4 I daily with oj Hgb today=9.6 To increase FeSo4 to BID Denies ice pica  -  Hemoglobin, venipuncture  2. Supervision of other normal pregnancy, antepartum Working at [employer] 40 hrs/wk Here with young daughter Has u/s 06/08/23 to f/u UTD 29 lb 3.2 oz (13.2 kg) 0 wt gain in last 2 wks [facility] dentist apt 03/28/23 and has another apt 05/24/23 Not exercising; encouraged to do so regularly  3. Elevated glucose tolerance test 05/03/23 1 hour=138 3 hour GTT =wnl 05/12/23  4. UTD fetal left kidney 5.5 mm Dx'd on 03/30/23 u/s at 23 4/7 Has f/u u/s 06/08/23  5. History of marijuana use Pt denies use   Preterm labor symptoms and general obstetric precautions including but not limited to vaginal bleeding, contractions, leaking of fluid and fetal movement were reviewed in detail with the patient. Please refer to After Visit Summary for other counseling recommendations.  Return in about 2 weeks (around 05/31/2023) for routine PNC.  Future Appointments  Date Time Provider Department Center  05/31/2023  9:00 AM [facility] PROVIDER [facility] None    Alberteen Spindle, CNM

## 2023-05-17 NOTE — Progress Notes (Deleted)
Verified has [facility] contact card. Aware of [facility] [place] appt on 06/08/23. Counseled on recommendation for flu vaccine in pregnancy and for those 27 months of age or older. Tolerated injection without complaint. Jossie Ng, RN

## 2023-05-31 ENCOUNTER — Ambulatory Visit: Payer: Medicaid Other | Admitting: Advanced Practice Midwife

## 2023-05-31 VITALS — BP 105/60 | HR 88 | Temp 98.2°F | Wt 189.4 lb

## 2023-05-31 DIAGNOSIS — O99013 Anemia complicating pregnancy, third trimester: Secondary | ICD-10-CM

## 2023-05-31 DIAGNOSIS — Z3480 Encounter for supervision of other normal pregnancy, unspecified trimester: Secondary | ICD-10-CM

## 2023-05-31 DIAGNOSIS — F5089 Other specified eating disorder: Secondary | ICD-10-CM

## 2023-05-31 DIAGNOSIS — Z3483 Encounter for supervision of other normal pregnancy, third trimester: Secondary | ICD-10-CM

## 2023-05-31 LAB — URINALYSIS
Bilirubin, UA: NEGATIVE
Glucose, UA: NEGATIVE
Ketones, UA: NEGATIVE
Leukocytes,UA: NEGATIVE
Nitrite, UA: NEGATIVE
RBC, UA: NEGATIVE
Specific Gravity, UA: 1.025 (ref 1.005–1.030)
Urobilinogen, Ur: 1 mg/dL (ref 0.2–1.0)
pH, UA: 7 (ref 5.0–7.5)

## 2023-05-31 NOTE — Progress Notes (Signed)
[facility] Department Maternal Health Clinic  PRENATAL VISIT NOTE  Subjective:  Samantha Heath is a 37 y.o. [id number] at [redacted]w[redacted]d being seen today for ongoing prenatal care.  She is currently monitored for the following issues for this low-risk pregnancy and has History of marijuana use; Anemia in pregnancy, third trimester; H/O oligohydramnios in prior pregnancy, currently pregnant; Need for Tdap vaccination; Supervision of other normal pregnancy, antepartum; History of abnormal cervical Pap smear; UTD fetal left kidney 5.5 mm; Pica ice; and Elevated glucose tolerance test 05/03/23 1 hour=138 on their problem list.  Patient reports no complaints.  Contractions: Not present. Vag. Bleeding: None.  Movement: Present. Denies leaking of fluid/ROM.   The following portions of the patient's history were reviewed and updated as appropriate: allergies, current medications, past family history, past medical history, past social history, past surgical history and problem list. Problem list updated.  Objective:   Vitals:   05/31/23 0800 05/31/23 0903  BP: 105/60 105/60  Pulse: 88 88  Temp: 98.2 F (36.8 C) 98.2 F (36.8 C)  Weight: 189 lb 6.4 oz (85.9 kg) 189 lb 6.4 oz (85.9 kg)    Fetal Status: Fetal Heart Rate (bpm): 138 Fundal Height: 32 cm Movement: Present     General:  Alert, oriented and cooperative. Patient is in no acute distress.  Skin: Skin is warm and dry. No rash noted.   Cardiovascular: Normal heart rate noted  Respiratory: Normal respiratory effort, no problems with respiration noted  Abdomen: Soft, gravid, appropriate for gestational age.  Pain/Pressure: Absent     Pelvic: Cervical exam deferred        Extremities: Normal range of motion.  Edema: None  Mental Status: Normal mood and affect. Normal behavior. Normal judgment and thought content.   Assessment and Plan:  Pregnancy: G5P4004 at [redacted]w[redacted]d  1. Supervision of other normal pregnancy, antepartum 29 lb 6.4 oz  (13.3 kg) 0 wt gain in last 2 wks [facility] dentist apt 03/28/23 and kept 05/24/23 apt; has f/u apt 06/28/23 Baby shower 06/25/23 Not exercising; encouraged to do so regularly Working 40 hrs/wk at [employer] 3 hour GTT wnl on 05/12/23 Has f/u u/s 06/08/23 for UTD  - Urinalysis (Urine Dip) - ToxAssure Flex 15, Ur  2. Pica ice denies  3. Anemia in pregnancy, third trimester Taking FeSo4 BID with oj   Preterm labor symptoms and general obstetric precautions including but not limited to vaginal bleeding, contractions, leaking of fluid and fetal movement were reviewed in detail with the patient. Please refer to After Visit Summary for other counseling recommendations.  Return in about 2 weeks (around 06/14/2023) for routine PNC.  No future appointments.  Alberteen Spindle, CNM

## 2023-05-31 NOTE — Progress Notes (Signed)
Patient here for MH RV at 31 6/7. Kick counts reviewed and 3 cards given. Patient would like RSV at next appointment.Burt Knack, RN

## 2023-05-31 NOTE — Progress Notes (Signed)
Urine dip reviewed with provider. No new orders.Burt Knack, RN

## 2023-06-04 LAB — TOXASSURE FLEX 15, UR
6-ACETYLMORPHINE IA: NEGATIVE ng/mL
7-aminoclonazepam: NOT DETECTED ng/mg{creat}
AMPHETAMINES IA: NEGATIVE ng/mL
Alpha-hydroxyalprazolam: NOT DETECTED ng/mg{creat}
Alpha-hydroxymidazolam: NOT DETECTED ng/mg{creat}
Alpha-hydroxytriazolam: NOT DETECTED ng/mg{creat}
Alprazolam: NOT DETECTED ng/mg{creat}
BARBITURATES IA: NEGATIVE ng/mL
BUPRENORPHINE: NEGATIVE
Benzodiazepines: NEGATIVE
Buprenorphine: NOT DETECTED ng/mg{creat}
CANNABINOIDS IA: NEGATIVE ng/mL
COCAINE METABOLITE IA: NEGATIVE ng/mL
Clonazepam: NOT DETECTED ng/mg{creat}
Creatinine: 168 mg/dL
Desalkylflurazepam: NOT DETECTED ng/mg{creat}
Desmethyldiazepam: NOT DETECTED ng/mg{creat}
Desmethylflunitrazepam: NOT DETECTED ng/mg{creat}
Diazepam: NOT DETECTED ng/mg{creat}
ETHYL ALCOHOL Enzymatic: NEGATIVE g/dL
FENTANYL: NEGATIVE
Fentanyl: NOT DETECTED ng/mg{creat}
Flunitrazepam: NOT DETECTED ng/mg{creat}
Lorazepam: NOT DETECTED ng/mg{creat}
METHADONE IA: NEGATIVE ng/mL
METHADONE MTB IA: NEGATIVE ng/mL
Midazolam: NOT DETECTED ng/mg{creat}
Norbuprenorphine: NOT DETECTED ng/mg{creat}
Norfentanyl: NOT DETECTED ng/mg{creat}
OPIATE CLASS IA: NEGATIVE ng/mL
OXYCODONE CLASS IA: NEGATIVE ng/mL
Oxazepam: NOT DETECTED ng/mg{creat}
PHENCYCLIDINE IA: NEGATIVE ng/mL
TAPENTADOL, IA: NEGATIVE ng/mL
TRAMADOL IA: NEGATIVE ng/mL
Temazepam: NOT DETECTED ng/mg{creat}

## 2023-06-14 ENCOUNTER — Ambulatory Visit: Payer: Medicaid Other | Admitting: Advanced Practice Midwife

## 2023-06-14 VITALS — BP 102/64 | HR 94 | Temp 98.1°F | Wt 190.2 lb

## 2023-06-14 DIAGNOSIS — Z3480 Encounter for supervision of other normal pregnancy, unspecified trimester: Secondary | ICD-10-CM

## 2023-06-14 DIAGNOSIS — F5089 Other specified eating disorder: Secondary | ICD-10-CM

## 2023-06-14 DIAGNOSIS — Z23 Encounter for immunization: Secondary | ICD-10-CM | POA: Diagnosis not present

## 2023-06-14 DIAGNOSIS — F1291 Cannabis use, unspecified, in remission: Secondary | ICD-10-CM

## 2023-06-14 DIAGNOSIS — Z3483 Encounter for supervision of other normal pregnancy, third trimester: Secondary | ICD-10-CM

## 2023-06-14 DIAGNOSIS — O99013 Anemia complicating pregnancy, third trimester: Secondary | ICD-10-CM

## 2023-06-14 LAB — HEMOGLOBIN, FINGERSTICK: Hemoglobin: 9.3 g/dL — ABNORMAL LOW (ref 11.1–15.9)

## 2023-06-14 NOTE — Progress Notes (Addendum)
Taking PNV daily in am. Then takes iron tablet with orange juice when gets to work. Frequently forgets to take pm iron tablet. Hgb due today. Encouraged to set alarm on cell phone as reminder to take pm iron tablet. Verified has [facility] contact card. Desires RSV vaccine today and tolerated without complaint. Jossie Ng, RN Hgb = 9.3 and reviewed by E. Sciora CNM. Per Ms. Sciora, plans referral to [facility]. As ferrintin needed for referral, anemia profile ordered today with ferritin. Jossie Ng, RN [facility] referral received from Hazle Coca CNM this am in [facility]. Following records printed and in outguide on [facility] nurse to do cart awaiting anemia profile / ferritin results from today so all records can be faxed: referral, snapshot pages, overview box, all encounter notes / [place] reports from current pregnancy, all available labs from current pregnancy and all hgb, hct, CBC, CBC/diff/platelets, anemia profile and sickle cell screen results found in EPIC. Above noted on referral in outguide. Jossie Ng, RN

## 2023-06-14 NOTE — Progress Notes (Addendum)
[facility] Department Maternal Health Clinic  PRENATAL VISIT NOTE  Subjective:  Samantha Heath is a 37 y.o. [id number] at [redacted]w[redacted]d being seen today for ongoing prenatal care.  She is currently monitored for the following issues for this low-risk pregnancy and has History of marijuana use; Anemia in pregnancy, third trimester; H/O oligohydramnios in prior pregnancy, currently pregnant; Need for Tdap vaccination; Supervision of other normal pregnancy, antepartum; History of abnormal cervical Pap smear; UTD fetal left kidney 5.5 mm; Pica ice; and Elevated glucose tolerance test 05/03/23 1 hour=138 on their problem list.  Patient reports carpal tunnel symptoms.  Contractions: Not present. Vag. Bleeding: None.  Movement: Present. Denies leaking of fluid/ROM.   The following portions of the patient's history were reviewed and updated as appropriate: allergies, current medications, past family history, past medical history, past social history, past surgical history and problem list. Problem list updated.  Objective:   Vitals:   06/14/23 0913  BP: 102/64  Pulse: 94  Temp: 98.1 F (36.7 C)  Weight: 190 lb 3.2 oz (86.3 kg)    Fetal Status: Fetal Heart Rate (bpm): 145 Fundal Height: 34 cm Movement: Present     General:  Alert, oriented and cooperative. Patient is in no acute distress.  Skin: Skin is warm and dry. No rash noted.   Cardiovascular: Normal heart rate noted  Respiratory: Normal respiratory effort, no problems with respiration noted  Abdomen: Soft, gravid, appropriate for gestational age.  Pain/Pressure: Absent     Pelvic: Cervical exam deferred        Extremities: Normal range of motion.  Edema: None  Mental Status: Normal mood and affect. Normal behavior. Normal judgment and thought content.   Assessment and Plan:  Pregnancy: G5P4004 at [redacted]w[redacted]d  1. Anemia in pregnancy, third trimester Taking FeSo4 I sometimes BID with oj but forgets C/o constipation--suggestions  given Check Hgb today=9.3 Consider referral to hematologist--done  - Hemoglobin, venipuncture  2. 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 Pica ice Denies use  4. History of marijuana use Denies use   Preterm labor symptoms and general obstetric precautions including but not limited to vaginal bleeding, contractions, leaking of fluid and fetal movement were reviewed in detail with the patient. Please refer to After Visit Summary for other counseling recommendations.  Return in about 2 weeks (around 06/28/2023) for routine PNC.  No future appointments.  Alberteen Spindle, CNM

## 2023-06-14 NOTE — Progress Notes (Deleted)
Desires RSV vaccine today and tolerated without complaint. Jossie Ng, RN

## 2023-06-14 NOTE — Addendum Note (Signed)
Addended by: Veatrice Kells on: 06/14/2023 10:20 AM   Modules accepted: Orders

## 2023-06-15 LAB — FE+CBC/D/PLT+TIBC+FER+RETIC
Basophils Absolute: 0 10*3/uL (ref 0.0–0.2)
Basos: 0 %
EOS (ABSOLUTE): 0.1 10*3/uL (ref 0.0–0.4)
Eos: 1 %
Ferritin: 39 ng/mL (ref 15–150)
Hematocrit: 31.9 % — ABNORMAL LOW (ref 34.0–46.6)
Hemoglobin: 10.7 g/dL — ABNORMAL LOW (ref 11.1–15.9)
Immature Grans (Abs): 0 10*3/uL (ref 0.0–0.1)
Immature Granulocytes: 0 %
Iron Saturation: 11 % — ABNORMAL LOW (ref 15–55)
Iron: 47 ug/dL (ref 27–159)
Lymphocytes Absolute: 1.1 10*3/uL (ref 0.7–3.1)
Lymphs: 24 %
MCH: 28.7 pg (ref 26.6–33.0)
MCHC: 33.5 g/dL (ref 31.5–35.7)
MCV: 86 fL (ref 79–97)
Monocytes Absolute: 0.4 10*3/uL (ref 0.1–0.9)
Monocytes: 8 %
Neutrophils Absolute: 3.3 10*3/uL (ref 1.4–7.0)
Neutrophils: 67 %
Platelets: 199 10*3/uL (ref 150–450)
RBC: 3.73 x10E6/uL — ABNORMAL LOW (ref 3.77–5.28)
RDW: 13.9 % (ref 11.7–15.4)
Retic Ct Pct: 1.9 % (ref 0.6–2.6)
Total Iron Binding Capacity: 434 ug/dL (ref 250–450)
UIBC: 387 ug/dL (ref 131–425)
WBC: 4.8 10*3/uL (ref 3.4–10.8)

## 2023-06-21 ENCOUNTER — Telehealth: Payer: Self-pay

## 2023-06-21 ENCOUNTER — Telehealth: Payer: Self-pay | Admitting: General Practice

## 2023-06-21 NOTE — Telephone Encounter (Signed)
has questions, did not elaborate, would like a call back.

## 2023-06-21 NOTE — Telephone Encounter (Signed)
Fax received from [facility] stating multiple unsuccessful attempts to contact client via phone to schedule appt. Call to client with above information and given [facility] phone number.Counseled to call for appt now and client agrees to do so. Jossie Ng, RN

## 2023-06-22 NOTE — Telephone Encounter (Signed)
Call to client who reports tried to schedule appt at [facility] yesterday, but was told needed labs had not been received so appt could not be scheduled. Counseled RN would call to determine what needed to be done and would communicate this with her. This RN called and spoke with Clydie Braun at [facility] who states MD note indicated needed labs not received. Referral, snapshot pages and all hematological / iron studies in EPIC once again faxed to [facility] with confirmation received. Return call to client to notify her of above and encouraged her to call [facility] in a few days if has not heard from them and she agrees to do so. Jossie Ng, RN

## 2023-06-23 NOTE — Telephone Encounter (Signed)
Correction: [facility] Referral, snapshot pages and all hematological / iron labs in [facility] faxed, but NO fax confirmation received. Fax attempt again 06/22/23 and early 06/23/23 and multiple times during day from different fax machines, but can't get to fax more than 20 pages and fax stops. Records given to Denzil Hughes in [facility] clerical and she is attempting to fax. Fax number has been verified at [facility]. Jossie Ng, RN

## 2023-06-23 NOTE — Telephone Encounter (Signed)
Denzil Hughes, [facility] intake clerk, was able to fax referral and needed records to [facility] and fax confirmation received. Jossie Ng, RN

## 2023-06-28 ENCOUNTER — Ambulatory Visit: Payer: Medicaid Other | Admitting: Advanced Practice Midwife

## 2023-06-28 VITALS — BP 115/58 | HR 105 | Temp 98.1°F | Wt 195.0 lb

## 2023-06-28 DIAGNOSIS — O99013 Anemia complicating pregnancy, third trimester: Secondary | ICD-10-CM

## 2023-06-28 DIAGNOSIS — Z3483 Encounter for supervision of other normal pregnancy, third trimester: Secondary | ICD-10-CM

## 2023-06-28 DIAGNOSIS — F5089 Other specified eating disorder: Secondary | ICD-10-CM

## 2023-06-28 DIAGNOSIS — Z3480 Encounter for supervision of other normal pregnancy, unspecified trimester: Secondary | ICD-10-CM

## 2023-06-28 DIAGNOSIS — R9389 Abnormal findings on diagnostic imaging of other specified body structures: Secondary | ICD-10-CM

## 2023-06-28 NOTE — Progress Notes (Signed)
[facility] Department Maternal Health Clinic  PRENATAL VISIT NOTE  Subjective:  Samantha Heath is a 37 y.o. A4Z6606 at [redacted]w[redacted]d being seen today for ongoing prenatal care.  She is currently monitored for the following issues for this high-risk pregnancy and has History of marijuana use; Anemia in pregnancy, third trimester; H/O oligohydramnios in prior pregnancy, currently pregnant; Need for Tdap vaccination; Supervision of other normal pregnancy, antepartum; History of abnormal cervical Pap smear; UTD fetal left kidney 5.5 mm; Pica ice; and Elevated glucose tolerance test 05/03/23 1 hour=138 on their problem list.  Patient reports fatigue.  Contractions: Not present. Vag. Bleeding: None.  Movement: Present. Denies leaking of fluid/ROM.   The following portions of the patient's history were reviewed and updated as appropriate: allergies, current medications, past family history, past medical history, past social history, past surgical history and problem list. Problem list updated.  Objective:   Vitals:   06/28/23 1416  BP: (!) 115/58  Pulse: (!) 105  Temp: 98.1 F (36.7 C)  Weight: 195 lb (88.5 kg)    Fetal Status: Fetal Heart Rate (bpm): 131 Fundal Height: 34 cm Movement: Present     General:  Alert, oriented and cooperative. Patient is in no acute distress.  Skin: Skin is warm and dry. No rash noted.   Cardiovascular: Normal heart rate noted  Respiratory: Normal respiratory effort, no problems with respiration noted  Abdomen: Soft, gravid, appropriate for gestational age.  Pain/Pressure: Absent     Pelvic: Cervical exam deferred        Extremities: Normal range of motion.  Edema: None  Mental Status: Normal mood and affect. Normal behavior. Normal judgment and thought content.   Assessment and Plan:  Pregnancy: G5P4004 at [redacted]w[redacted]d  1. Supervision of other normal pregnancy, antepartum Working 40 hrs/wk at [employer] Had baby shower 06/25/23 Is not exercising due to  fatigue [facility] 06/28/23 dental apt because 53 yo son got his lip stuck in his braces 35 lb (15.9 kg) 5 lb wt gain in last 2 wks   2. Anemia in pregnancy, third trimester Taking FeSo4 BID with oj [facility] Benign hematology referral faxed on 06/14/23--[facility] faxed labs to [facility] x3 and today they said they received them and will be calling pt for apt  3. Pica ice Denies eating  4. UTD fetal left kidney 5.5 mm Reviewed 06/08/23 u/s at 33 1/7 with EFW=23%, AFI wnl, vtx, posterior placenta, previously noted UTD has resolved, umbilical vein varix 12x12 mm--do not recommend any change in prenatal care management and no postnatal implications for fetus   Preterm labor symptoms and general obstetric precautions including but not limited to vaginal bleeding, contractions, leaking of fluid and fetal movement were reviewed in detail with the patient. Please refer to After Visit Summary for other counseling recommendations.  Return in about 1 week (around 07/05/2023) for 36 wk labs, routine PNC.  No future appointments.  Alberteen Spindle, CNM

## 2023-06-28 NOTE — Progress Notes (Signed)
TC to [facility] Hematology to request an appointment. Per Clydie Braun at [facility] they have received the labs and referral faxed for the 3rd time on 06/23/23, and she will give them to the MD to review and she expects they will call the patient for appointment by the end of this week. Patient informed to be ready for a [facility] call. Patient counseled to call us back if she doesn't hear from [facility] by Friday this week.Burt Knack, RN

## 2023-06-28 NOTE — Progress Notes (Signed)
Patient here for MH RV at 35 6/7. States had RSV vaccine at 06/14/23 visit. States she was told by [facility] they are still waiting for labs.Burt Knack, RN

## 2023-07-05 ENCOUNTER — Telehealth: Payer: Self-pay

## 2023-07-05 ENCOUNTER — Ambulatory Visit: Payer: Medicaid Other | Admitting: Advanced Practice Midwife

## 2023-07-05 VITALS — BP 98/68 | HR 86 | Temp 97.5°F | Wt 190.0 lb

## 2023-07-05 DIAGNOSIS — O99013 Anemia complicating pregnancy, third trimester: Secondary | ICD-10-CM

## 2023-07-05 DIAGNOSIS — Z3480 Encounter for supervision of other normal pregnancy, unspecified trimester: Secondary | ICD-10-CM

## 2023-07-05 DIAGNOSIS — F1291 Cannabis use, unspecified, in remission: Secondary | ICD-10-CM

## 2023-07-05 DIAGNOSIS — Z3483 Encounter for supervision of other normal pregnancy, third trimester: Secondary | ICD-10-CM

## 2023-07-05 NOTE — Telephone Encounter (Signed)
Client picked up FMLA forms from [facility]. Jossie Ng, RN

## 2023-07-05 NOTE — Progress Notes (Signed)
Correctly verbalizes how to take iron and PNV. Taking iron tablet with juice. Per [facility] Ben[facility] Hematology MD, due to hgb = 10.7 and normal ferritin, appt with Hematology Clinic not needed and client aware.

## 2023-07-05 NOTE — Telephone Encounter (Signed)
Call to client as [facility] paperwork available for pick up.Per recorded message, voicemail box is full. Approximately 5 minutes later phone call made and client answered. Counseled FMLA paperwork ready for pick up and states will be in [facility] shortly to pick up. Jossie Ng, RN

## 2023-07-05 NOTE — Progress Notes (Addendum)
[facility] Department Maternal Health Clinic  PRENATAL VISIT NOTE  Subjective:  Samantha Heath is a 37 y.o. Z6X0960 at [redacted]w[redacted]d being seen today for ongoing prenatal care.  She is currently monitored for the following issues for this low-risk pregnancy and has History of marijuana use; Anemia in pregnancy, third trimester; H/O oligohydramnios in prior pregnancy, currently pregnant; Supervision of other normal pregnancy, antepartum; History of abnormal cervical Pap smear; UTD fetal left kidney 5.5 mm= resolved on 06/08/23 u/s; Pica ice; Elevated glucose tolerance test 05/03/23 1 hour=138; and umbilical vein varix 12x12 mm on 06/08/23 u/s on their problem list.  Patient reports backache.  Contractions: Not present. Vag. Bleeding: None.  Movement: Present. Denies leaking of fluid/ROM.   The following portions of the patient's history were reviewed and updated as appropriate: allergies, current medications, past family history, past medical history, past social history, past surgical history and problem list. Problem list updated.  Objective:   Vitals:   07/05/23 0831  BP: 98/68  Pulse: 86  Temp: (!) 97.5 F (36.4 C)  Weight: 190 lb (86.2 kg)    Fetal Status: Fetal Heart Rate (bpm): 122 Fundal Height: 36 cm Movement: Present  Presentation: Vertex  General:  Alert, oriented and cooperative. Patient is in no acute distress.  Skin: Skin is warm and dry. No rash noted.   Cardiovascular: Normal heart rate noted  Respiratory: Normal respiratory effort, no problems with respiration noted  Abdomen: Soft, gravid, appropriate for gestational age.  Pain/Pressure: Absent     Pelvic: Cervical exam deferred        Extremities: Normal range of motion.  Edema: None  Mental Status: Normal mood and affect. Normal behavior. Normal judgment and thought content.   Assessment and Plan:  Pregnancy: G5P4004 at [redacted]w[redacted]d  1. Supervision of other normal pregnancy, antepartum Working 40 hrs/wk at  [employer] Not exercising due to fatigue 30 lb (13.6 kg) 5 lb wt loss in last week GC/Chlamydia/GBS cultures obtained Hasn't rescheduled dental apt yet from 06/28/23 Knows when to go to L&D Has crib but no car seat yet FMLA papers completed as pt requested  - Chlamydia/GC NAA, Confirmation - Culture, beta strep (group b only)  2. Anemia in pregnancy, third trimester Taking FeSo4 BID with oj [facility] hematology consult apt cancelled on 06/28/23 by [facility] due to ferritin now wnl  3. History of marijuana use Denies use   Preterm labor symptoms and general obstetric precautions including but not limited to vaginal bleeding, contractions, leaking of fluid and fetal movement were reviewed in detail with the patient. Please refer to After Visit Summary for other counseling recommendations.  Return in about 1 week (around 07/12/2023) for routine PNC.  No future appointments.  Alberteen Spindle, CNM

## 2023-07-05 NOTE — Progress Notes (Addendum)
Verified has [facility] contact card. Declined self-collection of 36 week cultures. Jossie Ng, RN FMLA paperwork received after client's appt. Paperwork copied, labled and sent for scanning. Phone call will be made to client to notify her forms available for pick up. Jossie Ng, RN

## 2023-07-07 LAB — CHLAMYDIA/GC NAA, CONFIRMATION
Chlamydia trachomatis, NAA: NEGATIVE
Neisseria gonorrhoeae, NAA: NEGATIVE

## 2023-07-08 LAB — CULTURE, BETA STREP (GROUP B ONLY): Strep Gp B Culture: NEGATIVE

## 2023-07-11 ENCOUNTER — Telehealth: Payer: Self-pay | Admitting: Family Medicine

## 2023-07-11 ENCOUNTER — Encounter: Payer: Self-pay | Admitting: Family Medicine

## 2023-07-11 NOTE — Telephone Encounter (Signed)
Please call me back I forgot to make my week appt for follow up

## 2023-07-11 NOTE — Telephone Encounter (Signed)
Call to client and [facility] RV appt due 07/12/23. Client to arrive tomorrow at 1045 and agreeable with plan. Jossie Ng, RN

## 2023-07-12 ENCOUNTER — Ambulatory Visit: Payer: Medicaid Other

## 2023-07-12 VITALS — BP 98/62 | HR 85 | Temp 98.2°F | Wt 187.2 lb

## 2023-07-12 DIAGNOSIS — Z3480 Encounter for supervision of other normal pregnancy, unspecified trimester: Secondary | ICD-10-CM

## 2023-07-12 DIAGNOSIS — Z3483 Encounter for supervision of other normal pregnancy, third trimester: Secondary | ICD-10-CM

## 2023-07-12 DIAGNOSIS — O99013 Anemia complicating pregnancy, third trimester: Secondary | ICD-10-CM

## 2023-07-12 LAB — HEMOGLOBIN, FINGERSTICK: Hemoglobin: 10.1 g/dL — ABNORMAL LOW (ref 11.1–15.9)

## 2023-07-12 NOTE — Progress Notes (Signed)
[facility] Department Maternal Health Clinic  PRENATAL VISIT NOTE  Subjective:  Samantha Heath is a 37 y.o. Z6X0960 at [redacted]w[redacted]d being seen today for ongoing prenatal care.  She is currently monitored for the following issues for this low-risk pregnancy and has History of marijuana use; Anemia in pregnancy, third trimester; H/O oligohydramnios in prior pregnancy, currently pregnant; Supervision of other normal pregnancy, antepartum; History of abnormal cervical Pap smear; Pica ice; Elevated glucose tolerance test 05/03/23 1 hour=138; and umbilical vein varix 12x12 mm on 06/08/23 u/s on their problem list.  Patient reports backache.  Contractions: Not present. Vag. Bleeding: None.  Movement: Present. Denies leaking of fluid/ROM.   The following portions of the patient's history were reviewed and updated as appropriate: allergies, current medications, past family history, past medical history, past social history, past surgical history and problem list. Problem list updated.  Objective:   Vitals:   07/12/23 1119  BP: 98/62  Pulse: 85  Temp: 98.2 F (36.8 C)  Weight: 187 lb 3.2 oz (84.9 kg)    Fetal Status: Fetal Heart Rate (bpm): 125 Fundal Height: 36 cm Movement: Present  Presentation: Vertex  General:  Alert, oriented and cooperative. Patient is in no acute distress.  Skin: Skin is warm and dry. No rash noted.   Cardiovascular: Normal heart rate noted  Respiratory: Normal respiratory effort, no problems with respiration noted  Abdomen: Soft, gravid, appropriate for gestational age.  Pain/Pressure: Absent     Pelvic: Cervical exam deferred        Extremities: Normal range of motion.  Edema: Mild pitting, slight indentation  Mental Status: Normal mood and affect. Normal behavior. Normal judgment and thought content.   Assessment and Plan:  Pregnancy: G5P4004 at [redacted]w[redacted]d  1. Supervision of other normal pregnancy, antepartum C/o low backache since last week; suggestions  given Last day of work was 07/08/23 Not exercising due to miseries of pregnancy Has car seat and crib Knows when to go to L&D 27 lb 3.2 oz (12.3 kg) 3 lb wt loss in last week Rescheduled dentist apt for 08/03/23  - Hemoglobin, venipuncture  2. Anemia in pregnancy, third trimester Taking FeSo4 BID with juice Heme apt was cancelled by heme because ferritin was wnl  - Hemoglobin, venipuncture   Preterm labor symptoms and general obstetric precautions including but not limited to vaginal bleeding, contractions, leaking of fluid and fetal movement were reviewed in detail with the patient. Please refer to After Visit Summary for other counseling recommendations.  Return in about 1 week (around 07/19/2023) for routine PNC.  Future Appointments  Date Time Provider Department Center  07/19/2023  4:00 PM [facility] PROVIDER AC-[facility] None    Alberteen Spindle, CNM

## 2023-07-12 NOTE — Progress Notes (Addendum)
Verified has [facility] contact card. Correctly verbalizes how to take iron and prenatal vitamin. Taking iron tablet with juice. Hgb due today. 1 week [facility] RV appt scheduled and reminder card given. Jossie Ng, RN Hgb = 10.1. Jossie Ng, RN

## 2023-07-19 ENCOUNTER — Ambulatory Visit: Payer: Medicaid Other | Admitting: Advanced Practice Midwife

## 2023-07-19 VITALS — Wt 190.6 lb

## 2023-07-19 DIAGNOSIS — Z3483 Encounter for supervision of other normal pregnancy, third trimester: Secondary | ICD-10-CM

## 2023-07-19 DIAGNOSIS — O99013 Anemia complicating pregnancy, third trimester: Secondary | ICD-10-CM

## 2023-07-19 DIAGNOSIS — R7309 Other abnormal glucose: Secondary | ICD-10-CM

## 2023-07-19 DIAGNOSIS — Z3480 Encounter for supervision of other normal pregnancy, unspecified trimester: Secondary | ICD-10-CM

## 2023-07-19 NOTE — Progress Notes (Signed)
[facility] Department Maternal Health Clinic  PRENATAL VISIT NOTE  Subjective:  Samantha Heath is a 37 y.o. A2Z3086 at [redacted]w[redacted]d being seen today for ongoing prenatal care.  She is currently monitored for the following issues for this low-risk pregnancy and has History of marijuana use; Anemia in pregnancy, third trimester; H/O oligohydramnios in prior pregnancy, currently pregnant; Supervision of other normal pregnancy, antepartum; History of abnormal cervical Pap smear; Pica ice; Elevated glucose tolerance test 05/03/23 1 hour=138; and umbilical vein varix 12x12 mm on 06/08/23 u/s on their problem list.  Patient reports  fatigue, sciatica .  Contractions: Not present. Vag. Bleeding: None.  Movement: Present. Denies leaking of fluid/ROM.   The following portions of the patient's history were reviewed and updated as appropriate: allergies, current medications, past family history, past medical history, past social history, past surgical history and problem list. Problem list updated.  Objective:   Vitals:   07/19/23 1625  Weight: 190 lb 9.6 oz (86.5 kg)    Fetal Status: Fetal Heart Rate (bpm): 146 Fundal Height: 37 cm Movement: Present  Presentation: Vertex  General:  Alert, oriented and cooperative. Patient is in no acute distress.  Skin: Skin is warm and dry. No rash noted.   Cardiovascular: Normal heart rate noted  Respiratory: Normal respiratory effort, no problems with respiration noted  Abdomen: Soft, gravid, appropriate for gestational age.  Pain/Pressure: Absent     Pelvic: Cervical exam performed Dilation: 1.5 Effacement (%): 50 Station: -3  Extremities: Normal range of motion.  Edema: Mild pitting, slight indentation  Mental Status: Normal mood and affect. Normal behavior. Normal judgment and thought content.   Assessment and Plan:  Pregnancy: G5P4004 at [redacted]w[redacted]d  1. Supervision of other normal pregnancy, antepartum 30 lb 9.6 oz (13.9 kg) 3 lb wt gain in last  week Pt desires IOL 08/03/23--paperwork completed Has dental apt 08/03/23 Not working--needs FMLA paperwork completed again Has crib and car seat Knows when to go to L&D Not exercising Bath and heating pad help sciatica when using, but not when stops using   2. Anemia in pregnancy, third trimester Taking FeSo4 BID with juice  3. Elevated glucose tolerance test 05/03/23 1 hour=138 3 hour GTT wnl on 05/12/23   Term labor symptoms and general obstetric precautions including but not limited to vaginal bleeding, contractions, leaking of fluid and fetal movement were reviewed in detail with the patient. Please refer to After Visit Summary for other counseling recommendations.  Return in about 1 week (around 07/26/2023) for routine PNC.  Future Appointments  Date Time Provider Department Center  07/25/2023  1:20 PM [facility] PROVIDER [facility] None  08/01/2023 10:40 AM [facility] PROVIDER AC-[facility] None    Alberteen Spindle, CNM

## 2023-07-19 NOTE — Progress Notes (Signed)
Patient here for MH RV at 38 6/7.Marland KitchenBurt Knack, RN

## 2023-07-21 ENCOUNTER — Telehealth: Payer: Self-pay

## 2023-07-21 NOTE — Telephone Encounter (Signed)
TC to patient to inform of [facility] IOL date, 08/03/23 and to expect TC from [facility] on that date between 11-2 for time to arrive at [facility]. Unable to LM, VM is full. TC to emergency contact and left message to please have patient call for appointment info. Left number to call.Marland KitchenMarland KitchenBurt Knack, RN

## 2023-07-25 ENCOUNTER — Ambulatory Visit: Payer: Medicaid Other | Admitting: Advanced Practice Midwife

## 2023-07-25 VITALS — BP 106/62 | HR 78 | Temp 97.7°F | Wt 191.8 lb

## 2023-07-25 DIAGNOSIS — R9389 Abnormal findings on diagnostic imaging of other specified body structures: Secondary | ICD-10-CM

## 2023-07-25 DIAGNOSIS — Z3480 Encounter for supervision of other normal pregnancy, unspecified trimester: Secondary | ICD-10-CM

## 2023-07-25 DIAGNOSIS — O99013 Anemia complicating pregnancy, third trimester: Secondary | ICD-10-CM

## 2023-07-25 DIAGNOSIS — Z3483 Encounter for supervision of other normal pregnancy, third trimester: Secondary | ICD-10-CM

## 2023-07-25 NOTE — Progress Notes (Signed)
[facility] Department Maternal Health Clinic  PRENATAL VISIT NOTE  Subjective:  Samantha Heath is a 37 y.o. Z6X0960 at [redacted]w[redacted]d being seen today for ongoing prenatal care.  She is currently monitored for the following issues for this low-risk pregnancy and has History of marijuana use; Anemia in pregnancy, third trimester; H/O oligohydramnios in prior pregnancy, currently pregnant; Supervision of other normal pregnancy, antepartum; History of abnormal cervical Pap smear; Pica ice; Elevated glucose tolerance test 05/03/23 1 hour=138; and umbilical vein varix 12x12 mm on 06/08/23 u/s on their problem list.  Patient reports backache and sciatica continues .  Contractions: Irregular. Vag. Bleeding: None.  Movement: Present. Denies leaking of fluid/ROM.   The following portions of the patient's history were reviewed and updated as appropriate: allergies, current medications, past family history, past medical history, past social history, past surgical history and problem list. Problem list updated.  Objective:   Vitals:   07/25/23 1315  BP: 106/62  Pulse: 78  Temp: 97.7 F (36.5 C)  Weight: 191 lb 12.8 oz (87 kg)    Fetal Status: Fetal Heart Rate (bpm): 120 Fundal Height: 37 cm Movement: Present  Presentation: Vertex  General:  Alert, oriented and cooperative. Patient is in no acute distress.  Skin: Skin is warm and dry. No rash noted.   Cardiovascular: Normal heart rate noted  Respiratory: Normal respiratory effort, no problems with respiration noted  Abdomen: Soft, gravid, appropriate for gestational age.  Pain/Pressure: Absent     Pelvic: Cervical exam performed Dilation: 3 Effacement (%): 70 Station: -3  Extremities: Normal range of motion.  Edema: Mild pitting, slight indentation  Mental Status: Normal mood and affect. Normal behavior. Normal judgment and thought content.   Assessment and Plan:  Pregnancy: G5P4004 at [redacted]w[redacted]d  1. Supervision of other normal pregnancy,  antepartum Not working 31 lb 12.8 oz (14.4 kg) 1 lb wt gain in last week IOL 08/03/23 Doing on-line squats, stretching 7x/wk Pt requested stripping of membranes--done easily  2. Anemia in pregnancy, third trimester Taking FeSo4 BID with juice  3. 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 Please refer to After Visit Summary for other counseling recommendations.  Return in about 1 week (around 08/01/2023) for routine PNC.  Future Appointments  Date Time Provider Department Center  08/01/2023 10:40 AM [facility] PROVIDER [facility] None    Alberteen Spindle, CNM

## 2023-07-25 NOTE — Telephone Encounter (Signed)
Kept 07/25/23 MHC RV appt and aware of IOL appt date. Jossie Ng, RN

## 2023-07-28 DIAGNOSIS — Z3483 Encounter for supervision of other normal pregnancy, third trimester: Secondary | ICD-10-CM

## 2023-08-01 ENCOUNTER — Ambulatory Visit: Payer: Medicaid Other

## 2023-10-03 ENCOUNTER — Ambulatory Visit: Payer: Medicaid Other

## 2023-10-03 ENCOUNTER — Telehealth: Payer: Self-pay

## 2023-10-03 NOTE — Telephone Encounter (Signed)
 Call to client to reschedule missed post-partum appt 10/03/23. Per client, has a sick child. AM appt requested and scheduled for 10/13/23. Per client, desires ocps for birth control. Jossie Ng, RN

## 2023-10-13 ENCOUNTER — Ambulatory Visit: Payer: Medicaid Other

## 2023-11-17 ENCOUNTER — Other Ambulatory Visit: Payer: Self-pay

## 2023-11-17 ENCOUNTER — Emergency Department
Admission: EM | Admit: 2023-11-17 | Discharge: 2023-11-17 | Disposition: A | Discharge status: Home / Self Care | Attending: Emergency Medicine | Admitting: Emergency Medicine

## 2023-11-17 ENCOUNTER — Emergency Department

## 2023-11-17 DIAGNOSIS — S52202A Unspecified fracture of shaft of left ulna, initial encounter for closed fracture: Secondary | ICD-10-CM | POA: Insufficient documentation

## 2023-11-17 DIAGNOSIS — W108XXA Fall (on) (from) other stairs and steps, initial encounter: Secondary | ICD-10-CM | POA: Diagnosis not present

## 2023-11-17 DIAGNOSIS — M79632 Pain in left forearm: Secondary | ICD-10-CM | POA: Diagnosis present

## 2023-11-17 MED ORDER — OXYCODONE-ACETAMINOPHEN 5-325 MG PO TABS: 1.0000 | ORAL_TABLET | ORAL | 0 refills | Status: AC | PRN

## 2023-11-17 MED ORDER — IBUPROFEN 600 MG PO TABS: 600.0000 mg | ORAL_TABLET | Freq: Four times a day (QID) | ORAL | 0 refills | Status: AC | PRN

## 2023-11-17 NOTE — ED Triage Notes (Signed)
 Patient arrives POV c/o falling yesterday. Patient reports falling "at least 10 stairs" and tumbled down. Patient denies LOC or hitting head. Patient reports pain and swelling to left forearm; states she has been unable to hold her 35 month old d/t the pain. Patient is a&0 x4; denies taking any blood thinners.

## 2023-11-17 NOTE — ED Provider Notes (Signed)
 [facility] Provider Note    Event Date/Time   First MD Initiated Contact with Patient 11/17/23 1701     (approximate)   History   Fall   HPI  Samantha Heath is a 38 y.o. female with no significant past medical history presents emergency department stating that she fell down at least 10 stairs yesterday.  No head injury, no LOC.  Landed on the left forearm.  States she has been able to hold her 59-month-old due to the pain in the left forearm and has been swollen.  No other injuries reported other than bruising      Physical Exam   Triage Vital Signs: ED Triage Vitals  Encounter Vitals Group     BP 11/17/23 1414 108/81     Systolic BP Percentile --      Diastolic BP Percentile --      Pulse Rate 11/17/23 1414 90     Resp --      Temp 11/17/23 1414 98.6 F (37 C)     Temp Source 11/17/23 1414 Oral     SpO2 11/17/23 1414 100 %     Weight 11/17/23 1414 162 lb (73.5 kg)     Height 11/17/23 1414 5\' 10"  (1.778 m)     Head Circumference --      Peak Flow --      Pain Score 11/17/23 1412 8     Pain Loc --      Pain Education --      Exclude from Growth Chart --     Most recent vital signs: Vitals:   11/17/23 1414  BP: 108/81  Pulse: 90  Temp: 98.6 F (37 C)  SpO2: 100%     General: Awake, no distress.   CV:  Good peripheral perfusion. regular rate and  rhythm Resp:  Normal effort.  Abd:  No distention.   Other:  Left forearm is swollen tender midshaft, distal pulses intact, neurovascular intact, full range of motion of the elbow and the wrist   ED Results / Procedures / Treatments   Labs (all labs ordered are listed, but only abnormal results are displayed) Labs Reviewed - No data to display   EKG     RADIOLOGY X-ray left forearm, left elbow, left wrist    PROCEDURES:   Procedures Chief Complaint  Patient presents with   Fall      MEDICATIONS ORDERED IN ED: Medications - No data to display   IMPRESSION /  MDM / ASSESSMENT AND PLAN / ED COURSE  I reviewed the triage vital signs and the nursing notes.                              Differential diagnosis includes, but is not limited to, fracture, contusion, sprain  Patient's presentation is most consistent with acute complicated illness / injury requiring diagnostic workup.   X-ray of the left forearm independently reviewed interpreted by me as having a midshaft fracture, radiologist comments may be comminuted  I did explain findings to patient.  She is placed in a sugar-tong OCL.  Given a sling.  Ibuprofen and Percocet for pain as needed.  Follow-up with orthopedics.  Patient is in agreement with treatment plan.  Discharged stable condition.      FINAL CLINICAL IMPRESSION(S) / ED DIAGNOSES   Final diagnoses:  Closed fracture of shaft of left ulna, unspecified fracture morphology, initial encounter  Rx / DC Orders   ED Discharge Orders          Ordered    ibuprofen (ADVIL) 600 MG tablet  Every 6 hours PRN        11/17/23 1712    oxyCODONE-acetaminophen (PERCOCET) 5-325 MG tablet  Every 4 hours PRN        11/17/23 1712             Note:  This document was prepared using Dragon voice recognition software and may include unintentional dictation errors.    Faythe Ghee, PA-C 11/17/23 Karlton Lemon, MD 11/17/23 [phone]
